# Patient Record
Sex: Female | Born: 1980 | Race: White | Hispanic: No | Smoking: Former smoker
Health system: Southern US, Community
[De-identification: ages and names within clinical notes are randomized; demographics above are authoritative.]

## PROBLEM LIST (undated history)

## (undated) DIAGNOSIS — K7689 Other specified diseases of liver: Secondary | ICD-10-CM

## (undated) DIAGNOSIS — K219 Gastro-esophageal reflux disease without esophagitis: Secondary | ICD-10-CM

## (undated) DIAGNOSIS — K5790 Diverticulosis of intestine, part unspecified, without perforation or abscess without bleeding: Secondary | ICD-10-CM

## (undated) DIAGNOSIS — F419 Anxiety disorder, unspecified: Secondary | ICD-10-CM

## (undated) DIAGNOSIS — U071 COVID-19: Secondary | ICD-10-CM

## (undated) DIAGNOSIS — F329 Major depressive disorder, single episode, unspecified: Secondary | ICD-10-CM

## (undated) DIAGNOSIS — K5792 Diverticulitis of intestine, part unspecified, without perforation or abscess without bleeding: Secondary | ICD-10-CM

## (undated) DIAGNOSIS — F32A Depression, unspecified: Secondary | ICD-10-CM

## (undated) HISTORY — DX: Anxiety disorder, unspecified: F41.9

## (undated) HISTORY — DX: Major depressive disorder, single episode, unspecified: F32.9

## (undated) HISTORY — DX: Depression, unspecified: F32.A

## (undated) HISTORY — DX: Gastro-esophageal reflux disease without esophagitis: K21.9

## (undated) HISTORY — PX: CARDIAC SURGERY: SHX584

## (undated) HISTORY — DX: Other specified diseases of liver: K76.89

## (undated) HISTORY — PX: VALVE REPLACEMENT: SUR13

---
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
## 2011-10-29 ENCOUNTER — Ambulatory Visit (INDEPENDENT_AMBULATORY_CARE_PROVIDER_SITE_OTHER): Payer: BC Managed Care – PPO | Admitting: Family Medicine

## 2011-10-29 VITALS — BP 109/72 | HR 66 | Temp 97.9°F | Resp 16 | Ht 63.0 in | Wt 112.6 lb

## 2011-10-29 DIAGNOSIS — M542 Cervicalgia: Secondary | ICD-10-CM

## 2011-10-29 DIAGNOSIS — M549 Dorsalgia, unspecified: Secondary | ICD-10-CM

## 2011-10-29 MED ORDER — METHOCARBAMOL 750 MG PO TABS: 750.0000 mg | ORAL_TABLET | Freq: Four times a day (QID) | ORAL | Status: AC

## 2011-10-29 MED ORDER — OXAPROZIN 600 MG PO TABS: 1200.0000 mg | ORAL_TABLET | Freq: Every day | ORAL | Status: AC

## 2011-10-29 MED ORDER — HYDROCODONE-ACETAMINOPHEN 5-500 MG PO TABS: 1.0000 | ORAL_TABLET | ORAL | Status: AC | PRN

## 2011-10-29 NOTE — Progress Notes (Signed)
Subjective: 31 year old female who was at a bachelorette party this weekend. She does not recall doing anything out of the ordinary that might have caused neck or back pains. Saturday was a fairly benign day. She slept Saturday night. Sunday morning she awakened with pain in her neck. Next having gotten worse to where it is intolerable. It radiates from the occiput down to the low back. His discomfort into the upper parts of her arms. Although she has had neck pain in the past, nothing like this.  Objective: Alert oriented young lady in moderate distress at this time. Any movement of her head and neck causes severe pain. She has decreased rotation, flexion, extension, and tilt of her head. Adequate range of motion of her upper arms. Fair flexion of her back. Exquisitely tender in the neck, trapezius, paraspinous muscles down her back.  Assessment: Cervical pain and spasm, with low back involvement.  Plan: Muscle relaxants and pain medication.

## 2011-10-29 NOTE — Patient Instructions (Signed)

## 2011-10-31 ENCOUNTER — Ambulatory Visit (INDEPENDENT_AMBULATORY_CARE_PROVIDER_SITE_OTHER): Payer: BC Managed Care – PPO | Admitting: Family Medicine

## 2011-10-31 ENCOUNTER — Ambulatory Visit: Payer: BC Managed Care – PPO

## 2011-10-31 VITALS — BP 108/67 | HR 51 | Temp 98.2°F | Resp 18 | Ht 64.0 in | Wt 114.0 lb

## 2011-10-31 DIAGNOSIS — M5380 Other specified dorsopathies, site unspecified: Secondary | ICD-10-CM

## 2011-10-31 DIAGNOSIS — M542 Cervicalgia: Secondary | ICD-10-CM

## 2011-10-31 DIAGNOSIS — M6282 Rhabdomyolysis: Secondary | ICD-10-CM

## 2011-10-31 DIAGNOSIS — M62838 Other muscle spasm: Secondary | ICD-10-CM

## 2011-10-31 DIAGNOSIS — M62830 Muscle spasm of back: Secondary | ICD-10-CM

## 2011-10-31 MED ORDER — KETOROLAC TROMETHAMINE 30 MG/ML IJ SOLN: 30.0000 mg | Freq: Once | INTRAMUSCULAR | Status: DC

## 2011-10-31 MED ORDER — DIAZEPAM 5 MG PO TABS: 5.0000 mg | ORAL_TABLET | Freq: Three times a day (TID) | ORAL | Status: AC | PRN

## 2011-10-31 MED ORDER — KETOROLAC TROMETHAMINE 30 MG/ML IJ SOLN
30.0000 mg | Freq: Once | INTRAMUSCULAR | Status: AC
  Administered 2011-10-31: 30 mg via INTRAMUSCULAR

## 2011-10-31 MED ORDER — KETOROLAC TROMETHAMINE 30 MG/ML IM SOLN: 30.0000 mg | Freq: Once | INTRAMUSCULAR | Status: DC

## 2011-10-31 NOTE — Patient Instructions (Addendum)
Pain medication and muscle relaxant.  Increase Celexa to 2 tablets at bedtime.  Diazepam  5 mg every 8 hours for relaxation.  Referral is being made to physical therapy. If you do not hear from the office by tomorrow morning with regard to clean it is set up for, please call.

## 2011-10-31 NOTE — Progress Notes (Signed)
Subjective: Patient was here 2 days ago. Over the weekend she developed severe muscle spasming. She of any injury. We treated her with anti-inflammatory meds, pain meds, and opioid medication. She has not gotten any relief. In fact has gotten worse. She is very distressed and painful after waiting here so long today. Hurts her neck although again her back. Again the right arm.   objective: White female in significant discomfort distress. Her neck is fair range is extremely tender down both paraspinous muscle areas although again to the low back. Tender spasms bilateral trapezius.  Assessment:  Cervical and back spasm severe pain  Plan: C-spine [facility] reading (PRIMARY) by  Dr. Alwyn Ren cervical x-rays show straightening but otherwise normal.  And Valium for additional muscle relaxation. Gave with 30 mg of Toradol injection here. Make referral to physical therapy. Need evaluation and treatment.

## 2015-08-29 DIAGNOSIS — E785 Hyperlipidemia, unspecified: Secondary | ICD-10-CM | POA: Insufficient documentation

## 2015-08-29 DIAGNOSIS — J309 Allergic rhinitis, unspecified: Secondary | ICD-10-CM | POA: Insufficient documentation

## 2015-08-29 DIAGNOSIS — G8929 Other chronic pain: Secondary | ICD-10-CM | POA: Insufficient documentation

## 2015-08-29 DIAGNOSIS — M542 Cervicalgia: Secondary | ICD-10-CM

## 2015-08-29 DIAGNOSIS — M5020 Other cervical disc displacement, unspecified cervical region: Secondary | ICD-10-CM | POA: Insufficient documentation

## 2018-07-08 ENCOUNTER — Emergency Department (INDEPENDENT_AMBULATORY_CARE_PROVIDER_SITE_OTHER): Payer: BLUE CROSS/BLUE SHIELD

## 2018-07-08 ENCOUNTER — Emergency Department (INDEPENDENT_AMBULATORY_CARE_PROVIDER_SITE_OTHER)
Admission: EM | Admit: 2018-07-08 | Discharge: 2018-07-08 | Disposition: A | Discharge status: Home / Self Care | Payer: BLUE CROSS/BLUE SHIELD | Source: Home / Self Care | Attending: Family Medicine | Admitting: Family Medicine

## 2018-07-08 ENCOUNTER — Emergency Department
Admission: EM | Admit: 2018-07-08 | Discharge: 2018-07-08 | Discharge status: Absent without leave | Payer: Self-pay | Source: Home / Self Care

## 2018-07-08 ENCOUNTER — Encounter: Payer: Self-pay | Admitting: *Deleted

## 2018-07-08 DIAGNOSIS — R0789 Other chest pain: Secondary | ICD-10-CM

## 2018-07-08 DIAGNOSIS — K219 Gastro-esophageal reflux disease without esophagitis: Secondary | ICD-10-CM | POA: Diagnosis not present

## 2018-07-08 DIAGNOSIS — S39011A Strain of muscle, fascia and tendon of abdomen, initial encounter: Secondary | ICD-10-CM

## 2018-07-08 DIAGNOSIS — R109 Unspecified abdominal pain: Secondary | ICD-10-CM

## 2018-07-08 DIAGNOSIS — R1012 Left upper quadrant pain: Secondary | ICD-10-CM

## 2018-07-08 DIAGNOSIS — R1013 Epigastric pain: Secondary | ICD-10-CM | POA: Diagnosis not present

## 2018-07-08 HISTORY — DX: Diverticulosis of intestine, part unspecified, without perforation or abscess without bleeding: K57.90

## 2018-07-08 HISTORY — DX: Diverticulitis of intestine, part unspecified, without perforation or abscess without bleeding: K57.92

## 2018-07-08 LAB — POCT CBC W AUTO DIFF (K'VILLE URGENT CARE)

## 2018-07-08 LAB — POCT URINALYSIS DIP (MANUAL ENTRY)
BILIRUBIN UA: NEGATIVE
Blood, UA: NEGATIVE
Glucose, UA: NEGATIVE mg/dL
Leukocytes, UA: NEGATIVE
NITRITE UA: NEGATIVE
PH UA: 5.5 (ref 5.0–8.0)
Protein Ur, POC: NEGATIVE mg/dL
Spec Grav, UA: 1.02 (ref 1.010–1.025)
Urobilinogen, UA: 0.2 E.U./dL

## 2018-07-08 MED ORDER — ESOMEPRAZOLE MAGNESIUM 40 MG PO CPDR: DELAYED_RELEASE_CAPSULE | ORAL | 1 refills | Status: DC

## 2018-07-08 MED ORDER — IOPAMIDOL (ISOVUE-300) INJECTION 61%
100.0000 mL | Freq: Once | INTRAVENOUS | Status: AC | PRN
  Administered 2018-07-08: 100 mL via INTRAVENOUS

## 2018-07-08 NOTE — ED Triage Notes (Signed)
Recent intense workout sessions and during a session a week ago pt had sudden onset stabbing type LUQ/epigastric abd pain which has persisted. Onset of N/V/D within 2-3 days of pain onset which has also persisted. Pt has refrained from workouts for the last 2-3 days but pain continues. Past hx of diverticulitis last August which she relates as similar pain. Also, intermittent right upper chest pain which she also describes as stabbing and is associated with dyspnea.

## 2018-07-08 NOTE — ED Provider Notes (Signed)
[facility]    CSN: [id number] Arrival date & time: 07/08/18  1319     History   Chief Complaint Chief Complaint  Patient presents with  . Abdominal Pain  . Nausea  . Emesis  . Chest Pain  . Diarrhea    HPI Gwendolyn Lee is a 37 y.o. female.   Patient participates in intense physical exercise six days per week.  About one week ago after a workout she developed pleuritic pain in her left inferior/lateral chest that has persisted.  She reports that she has a chronic smoker's cough, always worse upon awakening, and her left chest pain is exacerbated by cough, but she denies dyspnea.  Patient is s/p cardiac valve repair 1988, with median sternotomy sutures in place. About 2 to 3 days after developing left chest pain, she developed nausea/vomiting and diarrhea with low grade fever and left lower abdominal pain.  She states that her bowel movements are normally always somewhat loose.  Her GI symptoms have improved but she still has left abdominal pain.  She had diverticulitis in August 2018 (verified by CT abdomen), and her present pain feels similar.  During the past week patient has also experienced substernal discomfort when swallowing, improved somewhat with Tums. Patient's last menstrual period was 06/16/2018.  She notes that she had spotting yesterday, but denies vaginal discharge or pelvic pain.  The history is provided by the patient.    Past Medical History:  Diagnosis Date  . Diverticulitis   . Diverticulosis     Active problems:  none   Past Surgical History:  Procedure Laterality Date  . CARDIAC SURGERY             Home Medications    Prior to Admission medications   Not on File Ibuprofen, Tums    Family History Family History  Problem Relation Age of Onset  . Healthy Mother   . Healthy Father     Social History Social History   Tobacco Use  . Smoking status: Current Every Day Smoker    Packs/day: 0.30  . Smokeless tobacco: Never  Used  Substance Use Topics  . Alcohol use: Yes  . Drug use: Not on file     Allergies   Ceclor [cefaclor]   Review of Systems Review of Systems  Constitutional: Positive for activity change, appetite change, diaphoresis and fatigue. Negative for chills, fever and unexpected weight change.  HENT: Negative.   Eyes: Negative.   Respiratory: Positive for cough and chest tightness. Negative for choking, shortness of breath and wheezing.   Cardiovascular: Negative.   Gastrointestinal: Positive for abdominal pain, diarrhea, nausea and vomiting. Negative for blood in stool.  Genitourinary: Negative.   Musculoskeletal: Negative.   Skin: Negative.   Neurological: Negative for headaches.     Physical Exam Triage Vital Signs ED Triage Vitals  Enc Vitals Group     BP 07/08/18 1348 125/82     Pulse Rate 07/08/18 1348 82     Resp 07/08/18 1348 16     Temp 07/08/18 1348 98.2 F (36.8 C)     Temp Source 07/08/18 1348 Oral     SpO2 07/08/18 1348 99 %     Weight 07/08/18 1349 120 lb (54.4 kg)     Height 07/08/18 1349 5\' 5"  (1.651 m)     Head Circumference --      Peak Flow --      Pain Score 07/08/18 1348 5     Pain Loc --  Pain Edu? --      Excl. in [place]? --    No data found.  Updated Vital Signs BP 125/82 (BP Location: Right Arm)   Pulse 82   Temp 98.2 F (36.8 C) (Oral)   Resp 16   Ht 5\' 5"  (1.651 m)   Wt 54.4 kg   LMP 06/16/2018   SpO2 99%   BMI 19.97 kg/m   Visual Acuity Right Eye Distance:   Left Eye Distance:   Bilateral Distance:    Right Eye Near:   Left Eye Near:    Bilateral Near:     Physical Exam Vitals signs and nursing note reviewed.  Constitutional:      General: She is in acute distress.     Appearance: She is well-developed. She is not ill-appearing or diaphoretic.  HENT:     Head: Normocephalic.     Right Ear: Tympanic membrane, ear canal and external ear normal.     Left Ear: Tympanic membrane, ear canal and external ear normal.      Nose: Nose normal.     Mouth/Throat:     Mouth: Mucous membranes are moist.     Pharynx: Oropharynx is clear.  Eyes:     Conjunctiva/sclera: Conjunctivae normal.     Pupils: Pupils are equal, round, and reactive to light.  Neck:     Musculoskeletal: Neck supple.  Cardiovascular:     Rate and Rhythm: Normal rate.     Heart sounds: Normal heart sounds.  Pulmonary:     Effort: Pulmonary effort is normal.     Breath sounds: Normal breath sounds. No wheezing or rales.    Chest:     Chest wall: Tenderness present.    Abdominal:     General: Abdomen is flat. There is no distension.     Palpations: Abdomen is soft. There is no mass.     Tenderness: There is abdominal tenderness. There is guarding. There is no right CVA tenderness, left CVA tenderness or rebound.     Hernia: No hernia is present.    Musculoskeletal:        General: No swelling or tenderness.     Right lower leg: No edema.     Left lower leg: No edema.  Lymphadenopathy:     Cervical: No cervical adenopathy.  Skin:    General: Skin is warm and dry.  Neurological:     General: No focal deficit present.     Mental Status: She is alert.      UC Treatments / Results  Labs (all labs ordered are listed, but only abnormal results are displayed) Labs Reviewed  POCT URINALYSIS DIP (MANUAL ENTRY) - Abnormal; Notable for the following components:      Result Value   Ketones, POC UA large (80) (*)    All other components within normal limits  COMPLETE METABOLIC PANEL WITH GFR  AMYLASE  LIPASE  POCT CBC W AUTO DIFF ([facility]):  WBC 6.6; LY 35.0; MO 19.2; GR 45.8; Hgb 13.6; Platelets 122     EKG None  Radiology Dg Ribs Unilateral W/chest Left  Result Date: 07/08/2018 CLINICAL DATA:  Sudden onset of stabbing left upper quadrant epigastric pain while working out last week. EXAM: LEFT RIBS AND CHEST - 3+ VIEW COMPARISON:  None. FINDINGS: No fracture or other bone lesions are seen involving the ribs. The  patient is status post median sternotomy. The most inferior sternotomy wire is broken. There is no evidence of pneumothorax or  pleural effusion. Both lungs are clear. Heart size and mediastinal contours are within normal limits. IMPRESSION: Negative for acute cardiopulmonary nor osseous abnormality. Electronically Signed   By: Ashley Royalty M.D.   On: 07/08/2018 14:50   Ct Abdomen Pelvis W Contrast  Result Date: 07/08/2018 CLINICAL DATA:  37 y/o  F; left-sided abdominal pain for 1 week. EXAM: CT ABDOMEN AND PELVIS WITH CONTRAST TECHNIQUE: Multidetector CT imaging of the abdomen and pelvis was performed using the standard protocol following bolus administration of intravenous contrast. CONTRAST:  124mL ISOVUE-300 IOPAMIDOL (ISOVUE-300) INJECTION 61% COMPARISON:  03/04/2017 CT abdomen and pelvis FINDINGS: Lower chest: No acute abnormality. Hepatobiliary: Multiple liver hypodensities are stable measuring up to 16 mm in the right lobe of liver (series 2, image 26) compatible with cysts. No additional focal liver abnormality is seen. No gallstones, gallbladder wall thickening, or biliary dilatation. Pancreas: Unremarkable. No pancreatic ductal dilatation or surrounding inflammatory changes. Spleen: Normal in size without focal abnormality. Adrenals/Urinary Tract: Adrenal glands are unremarkable. Kidneys are normal, without renal calculi, focal lesion, or hydronephrosis. Bladder is unremarkable. Stomach/Bowel: Stomach is within normal limits. Appendix appears normal. No evidence of bowel wall thickening, distention, or inflammatory changes. Few sigmoid diverticula, no findings of acute diverticulitis at this time identified. Vascular/Lymphatic: No significant vascular findings are present. No enlarged abdominal or pelvic lymph nodes. Reproductive: Uterus and bilateral adnexa are unremarkable. Other: No abdominal wall hernia or abnormality. Trace fluid in the cul-de-sac, likely physiologic. Musculoskeletal: No fracture  is seen. IMPRESSION: No acute process identified.  Stable chronic findings as above. Electronically Signed   By: Kristine Garbe M.D.   On: 07/08/2018 18:28    Procedures Procedures (including critical care time)  Medications Ordered in UC Medications - No data to display  Initial Impression / Assessment and Plan / UC Course  I have reviewed the triage vital signs and the nursing notes.  Pertinent labs & imaging results that were available during my care of the patient were reviewed by me and considered in my medical decision making (see chart for details).    Note broken inferior sternotomy wire suture, otherwise negative chest x-ray.  Suspect sternum strain and/or left intercostal muscle strain vs ?costochondritis. Suspect GERD; begin trial of omeprazole 40mg  daily. Negative CT abdomen/pelvis, normal WBC and urinalysis reassuring.  CMP, amylase, lipase pending. Begin Nexium.  Recommend follow-up with GI in about 3 weeks. Recommend further evaluation by Sports Medicine.   Final Clinical Impressions(s) / UC Diagnoses   Final diagnoses:  Gastroesophageal reflux disease, esophagitis presence not specified  Musculoskeletal chest pain  Strain of rectus abdominis muscle, initial encounter     Discharge Instructions     Apply ice pack to lower sternum and left chest for 20 to 30 minutes, 3 to 4 times daily  Continue until pain and swelling decrease.     ED Prescriptions    Medication Sig Dispense Auth. Provider   esomeprazole (NEXIUM) 40 MG capsule Take one cap by mouth once daily, 20 minutes before a meal 30 capsule Kandra Nicolas, MD         Kandra Nicolas, MD 07/08/18 2123

## 2018-07-08 NOTE — Discharge Instructions (Signed)
Apply ice pack to lower sternum and left chest for 20 to 30 minutes, 3 to 4 times daily  Continue until pain and swelling decrease.

## 2018-07-10 ENCOUNTER — Telehealth: Payer: Self-pay

## 2018-07-10 LAB — COMPLETE METABOLIC PANEL WITH GFR
AG RATIO: 2.1 (calc) (ref 1.0–2.5)
ALT: 38 U/L — ABNORMAL HIGH (ref 6–29)
AST: 57 U/L — ABNORMAL HIGH (ref 10–30)
Albumin: 5 g/dL (ref 3.6–5.1)
Alkaline phosphatase (APISO): 45 U/L (ref 33–115)
BUN: 17 mg/dL (ref 7–25)
CALCIUM: 9.8 mg/dL (ref 8.6–10.2)
CO2: 23 mmol/L (ref 20–32)
Chloride: 104 mmol/L (ref 98–110)
Creat: 0.75 mg/dL (ref 0.50–1.10)
GFR, Est African American: 120 mL/min/{1.73_m2} (ref >=60)
GFR, Est Non African American: 103 mL/min/{1.73_m2} (ref >=60)
Globulin: 2.4 g/dL (calc) (ref 1.9–3.7)
Glucose, Bld: 69 mg/dL (ref 65–99)
Potassium: 3.6 mmol/L (ref 3.5–5.3)
Sodium: 138 mmol/L (ref 135–146)
Total Bilirubin: 0.6 mg/dL (ref 0.2–1.2)
Total Protein: 7.4 g/dL (ref 6.1–8.1)

## 2018-07-10 LAB — AMYLASE: Amylase: 60 U/L (ref 21–101)

## 2018-07-10 LAB — LIPASE: LIPASE: 42 U/L (ref 7–60)

## 2018-07-10 NOTE — Telephone Encounter (Signed)
Called patient left results of labs, and told her to follow up with PCP for recheck of liver function.

## 2018-07-21 ENCOUNTER — Ambulatory Visit: Payer: Self-pay | Admitting: Physician Assistant

## 2018-07-21 ENCOUNTER — Telehealth: Payer: Self-pay

## 2018-07-21 NOTE — Telephone Encounter (Signed)
Noted  

## 2018-07-21 NOTE — Telephone Encounter (Signed)
Pt called at 8:02 to cancel her new patient appt scheduled for 10:50.  FYI to provider

## 2018-08-01 ENCOUNTER — Ambulatory Visit: Payer: BLUE CROSS/BLUE SHIELD | Admitting: Physician Assistant

## 2018-08-11 ENCOUNTER — Ambulatory Visit (INDEPENDENT_AMBULATORY_CARE_PROVIDER_SITE_OTHER): Payer: BLUE CROSS/BLUE SHIELD | Admitting: Physician Assistant

## 2018-08-11 ENCOUNTER — Ambulatory Visit: Payer: BLUE CROSS/BLUE SHIELD | Admitting: Physician Assistant

## 2018-08-11 ENCOUNTER — Encounter: Payer: Self-pay | Admitting: Physician Assistant

## 2018-08-11 VITALS — BP 131/86 | HR 60 | Temp 98.7°F | Ht 65.0 in | Wt 124.0 lb

## 2018-08-11 DIAGNOSIS — Z7689 Persons encountering health services in other specified circumstances: Secondary | ICD-10-CM

## 2018-08-11 DIAGNOSIS — F331 Major depressive disorder, recurrent, moderate: Secondary | ICD-10-CM | POA: Diagnosis not present

## 2018-08-11 DIAGNOSIS — F1290 Cannabis use, unspecified, uncomplicated: Secondary | ICD-10-CM

## 2018-08-11 DIAGNOSIS — J0100 Acute maxillary sinusitis, unspecified: Secondary | ICD-10-CM

## 2018-08-11 DIAGNOSIS — F410 Panic disorder [episodic paroxysmal anxiety] without agoraphobia: Secondary | ICD-10-CM | POA: Insufficient documentation

## 2018-08-11 DIAGNOSIS — F5104 Psychophysiologic insomnia: Secondary | ICD-10-CM

## 2018-08-11 DIAGNOSIS — F419 Anxiety disorder, unspecified: Secondary | ICD-10-CM | POA: Diagnosis not present

## 2018-08-11 MED ORDER — SERTRALINE HCL 25 MG PO TABS: ORAL_TABLET | ORAL | 0 refills | Status: DC

## 2018-08-11 MED ORDER — BUSPIRONE HCL 7.5 MG PO TABS: ORAL_TABLET | ORAL | 1 refills | Status: DC

## 2018-08-11 MED ORDER — DOXYCYCLINE HYCLATE 100 MG PO TABS: 100.0000 mg | ORAL_TABLET | Freq: Two times a day (BID) | ORAL | 0 refills | Status: AC

## 2018-08-11 MED ORDER — TRAZODONE HCL 50 MG PO TABS: 25.0000 mg | ORAL_TABLET | Freq: Every evening | ORAL | 2 refills | Status: DC | PRN

## 2018-08-11 NOTE — Patient Instructions (Addendum)
Counseling: - psychologytoday.com: search engine to locate local counselors - Family Services in your county offer counseling on a sliding scale (pay what you can afford) - [facility]: we can place a referral for you to see one of licensed counselors in [place], [place], or [place] - online counseling: Chartered loss adjuster (not covered by insurance, but affordable self-pay rates)  Other resources: Best Overall for Online Counseling: Talkspace "Talkspace's unlimited messaging and video conferencing makes it a convenient app for addressing a variety of needs."   Best Live Chat Sessions: Betterhelp "Betterhelp allows a variety of ways to contact your therapist, including live chat sessions."  Best for Couples: [facility] "[facility] specializes in couples therapy and they offer a variety of services to help address relationship issues."  Best for a Quick Consultation: [facility]well "[facility]well offers access to a variety of mental health professionals any time of day or night."  Best for Peer Support: 7 Cups of Tea "You can access free support from peers on 7 Cups of Tea or you can choose to pay to speak to a professional."  Best for Teens: Teen Counsel[facility] "Teens can chat, message, speak over the phone, or video conference with a therapist who has experience treating their age group."  Best for LGBTQ: [facility] "[facility] offers online therapy to individuals in the [place] community, and their goal is to offer discreet, affordable, and accessible treatment."  Best Free Assessment: Doctor on Demand "Doctor on Demand provides access to a free assessment that can help you determine if you should talk to someone about anxiety or depression."  Best for Access to a Psychiatrist: [facility]DL[facility] "[facility]DL[facility] provides access to psychiatrists who can prescribe and manage psychiatric medications."  Safety Plan: if having self-harm or suicidal thoughts Our Office  [place] 1-800-SUICIDE If in immediate danger of harming yourself, go to the nearest emergency room or call 911

## 2018-08-11 NOTE — Progress Notes (Signed)
HPI:                                                                Gwendolyn Lee is a 38 y.o. female who presents to [place]: [facility] today to establish care  Current concerns:  Depression/Anxiety: has struggled with depression for 20+ years. Has been on and off medication throughout her life. Has not taken anything in over 10 years. Prior meds include Zoloft.  Has been separated from husband for 1 year and is struggling with this. States depression and anxiety symptoms have become debilitating. She is struggling to get out of bed in the morning. She is self-employed and has a big business trip coming up to [facility] that she needs to be ready for.  Denies symptoms of mania/hypomania. Denies suicidal thinking. Denies auditory/visual hallucinations.   Insomnia - this is a chronic problem, worsening recently. Using unknown OTC sleep aid and alternating with Tylenol PM as needed. She smokes mariuana nightly to help with sleep.  She is trying to eat healthier and is exercising 5-6 days per week, but this does not help with sleep or anxiety.  She also has had cold symptoms for approx 1 week. Complains of nasal congestion and sinus pressure with dry cough today. Denies fever, chest pain, sputum production or dyspnea.    Depression screen PHQ 2/9 08/11/2018  Decreased Interest 2  Down, Depressed, Hopeless 2  PHQ - 2 Score 4  Altered sleeping 3  Tired, decreased energy 3  Change in appetite 1  Feeling bad or failure about yourself  2  Trouble concentrating 0  Moving slowly or fidgety/restless 0  Suicidal thoughts 0  PHQ-9 Score 13    GAD 7 : Generalized Anxiety Score 08/11/2018  Nervous, Anxious, on Edge 3  Control/stop worrying 2  Worry too much - different things 2  Trouble relaxing 2  Restless 0  Easily annoyed or irritable 3  Afraid - awful might happen 0  Total GAD 7 Score 12  Anxiety Difficulty Very difficult      Past  Medical History:  Diagnosis Date  . Anxiety   . Depression   . Diverticulitis   . Diverticulitis   . Diverticulosis    Past Surgical History:  Procedure Laterality Date  . CARDIAC SURGERY     Social History   Tobacco Use  . Smoking status: Current Every Day Smoker    Packs/day: 0.30  . Smokeless tobacco: Never Used  Substance Use Topics  . Alcohol use: Yes    Alcohol/week: 20.0 standard drinks    Types: 20 Standard drinks or equivalent per week   family history includes Healthy in her father and mother.    ROS: Review of Systems  Constitutional: Positive for malaise/fatigue.  HENT: Positive for sinus pain and sore throat.   Respiratory: Positive for cough.   Neurological: Positive for headaches.  Psychiatric/Behavioral: Positive for depression. The patient is nervous/anxious and has insomnia.   All other systems reviewed and are negative.    Medications: No current outpatient medications on file.   No current facility-administered medications for this visit.    Allergies  Allergen Reactions  . Ceclor [Cefaclor] Rash       Objective:  BP 131/86  Pulse 60   Temp 98.7 F (37.1 C) (Oral)   Ht 5\' 5"  (1.651 m)   Wt 124 lb (56.2 kg)   LMP 07/14/2018 (Exact Date)   BMI 20.63 kg/m  Gen:  alert, not ill-appearing, no distress, appropriate for age 44: head normocephalic without obvious abnormality, conjunctiva and cornea clear, tympanic membranes pearly gray and semitransparent bilaterally, external ear canals normal bilaterally, no mastoid tenderness, nasal mucosa edematous, there is bilateral maxillary sinus tenderness, oropharynx clear, uvula midline, neck supple, no cervical adenopathy trachea midline Pulm: Normal work of breathing, normal phonation, clear to auscultation bilaterally, no wheezes, rales or rhonchi CV: Normal rate, regular rhythm, s1 and s2 distinct, no murmurs, clicks or rubs  Neuro: alert and oriented x 3, no tremor MSK: extremities  atraumatic, normal gait and station Skin: intact, no rashes on exposed skin, no jaundice, no cyanosis Psych: well-groomed, cooperative, good eye contact, depressed mood, affect mood-congruent, she is tearful, speech is articulate, and thought processes clear and goal-directed, normal judgment, good insight    No results found for this or any previous visit (from the past 72 hour(s)). No results found.    Assessment and Plan: 38 y.o. female with   .Gwendolyn Lee was seen today for establish care.  Diagnoses and all orders for this visit:  Encounter to establish care  Chronic insomnia -     traZODone (DESYREL) 50 MG tablet; Take 0.5-1 tablets (25-50 mg total) by mouth at bedtime as needed for sleep.  Adjustment disorder with mixed anxiety and depressed mood -     traZODone (DESYREL) 50 MG tablet; Take 0.5-1 tablets (25-50 mg total) by mouth at bedtime as needed for sleep. -     busPIRone (BUSPAR) 7.5 MG tablet; Take 1 tablet (7.5 mg total) by mouth 2 (two) times daily for 5 days, THEN 1 tablet (7.5 mg total) 3 (three) times daily for 25 days. -     sertraline (ZOLOFT) 25 MG tablet; Take 1 tablet (25 mg total) by mouth at bedtime for 3 days, THEN 2 tablets (50 mg total) at bedtime for 7 days, THEN 3 tablets (75 mg total) at bedtime for 14 days.  Acute non-recurrent maxillary sinusitis -     doxycycline (VIBRA-TABS) 100 MG tablet; Take 1 tablet (100 mg total) by mouth 2 (two) times daily for 7 days.   Depression/anxiety/chronic insomnia PHQ 9 equals 13, no acute safety issues Gad 7 equals 12 She has done well on sertraline in the past so we will restart this.  Will take a number of weeks to have any therapeutic effect and she has an upcoming trip planned.  Starting BuSpar 7 and half milligrams twice daily, titrate to 7-1/2 mg 3 times daily after 5 days Also adding trazodone 25 to 50 mg at bedtime as needed for sleep Encouraged to decrease her use of marijuana as this may interact with her  medications She was provided with information on local counselors and will contact me if she needs a referral Follow-up in 1 month  Acute maxillary sinusitis Persistent symptoms for over 1 week.  Treating empirically for bacterial sinusitis with doxycycline.  Patient education and anticipatory guidance given Patient agrees with treatment plan Follow-up in 1 month or sooner as needed if symptoms worsen or fail to improve  Darlyne Russian PA-C

## 2018-09-06 ENCOUNTER — Other Ambulatory Visit: Payer: Self-pay | Admitting: Physician Assistant

## 2018-09-06 DIAGNOSIS — F5104 Psychophysiologic insomnia: Secondary | ICD-10-CM

## 2018-09-06 DIAGNOSIS — F331 Major depressive disorder, recurrent, moderate: Secondary | ICD-10-CM

## 2018-09-08 ENCOUNTER — Encounter: Payer: Self-pay | Admitting: Physician Assistant

## 2018-09-08 ENCOUNTER — Ambulatory Visit (INDEPENDENT_AMBULATORY_CARE_PROVIDER_SITE_OTHER): Payer: BLUE CROSS/BLUE SHIELD | Admitting: Physician Assistant

## 2018-09-08 VITALS — BP 122/80 | HR 65 | Wt 123.0 lb

## 2018-09-08 DIAGNOSIS — F419 Anxiety disorder, unspecified: Secondary | ICD-10-CM | POA: Diagnosis not present

## 2018-09-08 DIAGNOSIS — R110 Nausea: Secondary | ICD-10-CM | POA: Insufficient documentation

## 2018-09-08 DIAGNOSIS — F5104 Psychophysiologic insomnia: Secondary | ICD-10-CM | POA: Diagnosis not present

## 2018-09-08 DIAGNOSIS — R197 Diarrhea, unspecified: Secondary | ICD-10-CM | POA: Insufficient documentation

## 2018-09-08 DIAGNOSIS — F331 Major depressive disorder, recurrent, moderate: Secondary | ICD-10-CM | POA: Diagnosis not present

## 2018-09-08 DIAGNOSIS — R7401 Elevation of levels of liver transaminase levels: Secondary | ICD-10-CM | POA: Insufficient documentation

## 2018-09-08 DIAGNOSIS — R740 Nonspecific elevation of levels of transaminase and lactic acid dehydrogenase [LDH]: Secondary | ICD-10-CM

## 2018-09-08 DIAGNOSIS — K5790 Diverticulosis of intestine, part unspecified, without perforation or abscess without bleeding: Secondary | ICD-10-CM

## 2018-09-08 DIAGNOSIS — N760 Acute vaginitis: Secondary | ICD-10-CM

## 2018-09-08 DIAGNOSIS — D696 Thrombocytopenia, unspecified: Secondary | ICD-10-CM | POA: Insufficient documentation

## 2018-09-08 MED ORDER — ESOMEPRAZOLE MAGNESIUM 40 MG PO CPDR: 40.0000 mg | DELAYED_RELEASE_CAPSULE | Freq: Every day | ORAL | 0 refills | Status: DC

## 2018-09-08 MED ORDER — VORTIOXETINE HBR 5 MG PO TABS: 5.0000 mg | ORAL_TABLET | Freq: Every day | ORAL | 1 refills | Status: DC

## 2018-09-08 MED ORDER — FLUCONAZOLE 150 MG PO TABS: 150.0000 mg | ORAL_TABLET | Freq: Once | ORAL | 0 refills | Status: AC

## 2018-09-08 MED ORDER — ONDANSETRON 4 MG PO TBDP: 4.0000 mg | ORAL_TABLET | Freq: Three times a day (TID) | ORAL | 0 refills | Status: DC | PRN

## 2018-09-08 MED ORDER — BUSPIRONE HCL 10 MG PO TABS: 10.0000 mg | ORAL_TABLET | Freq: Three times a day (TID) | ORAL | 2 refills | Status: DC

## 2018-09-08 NOTE — Progress Notes (Signed)
HPI:                                                                Gwendolyn Lee is a 38 y.o. female who presents to [facility] today to establish care   Depression/Anxiety: has struggled with depression for 20+ years. Has been on and off medication throughout her life. Has not taken anything in over 10 years. Prior meds include Zoloft.  Has been separated from husband for 1 year and is struggling with this. States depression and anxiety symptoms have become debilitating. She is struggling to get out of bed in the morning. She is self-employed and has a big business trip coming up to [facility] that she needs to be ready for.  Denies symptoms of mania/hypomania. Denies suicidal thinking. Denies auditory/visual hallucinations.   Insomnia - this is a chronic problem, worsening recently. Using unknown OTC sleep aid and alternating with Tylenol PM as needed. She smokes mariuana nightly to help with sleep.  She is trying to eat healthier and is exercising 5-6 days per week, but this does not help with sleep or anxiety. Drinks approx 20 drinks/week Occasionally smokes marijuana  Interval Hx 09/08/18 Reports only sleeping 4 hours per night with Trazodone 50 mg. No improvement in mood or depressive symptoms. Feels like Buspar is helping with anxiety. She is now seeing a Social worker at [facility]) weekly for an eating disorder Reports she is nauseous all the time and feels it is worsening over the last month. She occasionally vomits immediately following a meal. She is having loose stools 5-7 times per day. Symptoms are causing her to avoid food. She is losing weight.   She also feels like she may have a yeast infection. Since completing antibiotic for sinusitis she has had some mild vaginal discomfort/itching. Sexually active with 1 female partner, uses condoms. Denies known exposure to STI.  Depression screen [facility] 2/9 09/08/2018 08/11/2018   Decreased Interest 2 2  Down, Depressed, Hopeless 3 2  PHQ - 2 Score 5 4  Altered sleeping 3 3  Tired, decreased energy 3 3  Change in appetite 3 1  Feeling bad or failure about yourself  1 2  Trouble concentrating 0 0  Moving slowly or fidgety/restless 1 0  Suicidal thoughts 0 0  PHQ-9 Score 16 13    GAD 7 : Generalized Anxiety Score 09/08/2018 08/11/2018  Nervous, Anxious, on Edge 3 3  Control/stop worrying 3 2  Worry too much - different things 3 2  Trouble relaxing 3 2  Restless 1 0  Easily annoyed or irritable 2 3  Afraid - awful might happen 1 0  Total GAD 7 Score 16 12  Anxiety Difficulty - Very difficult      Past Medical History:  Diagnosis Date  . Anxiety   . Depression   . Diverticulitis   . Diverticulitis   . Diverticulosis    Past Surgical History:  Procedure Laterality Date  . CARDIAC SURGERY    . VALVE REPLACEMENT     Social History   Tobacco Use  . Smoking status: Current Every Day Smoker    Packs/day: 0.30  . Smokeless tobacco: Never Used  Substance Use Topics  . Alcohol use:  Yes    Alcohol/week: 20.0 standard drinks    Types: 20 Standard drinks or equivalent per week   family history includes Healthy in her father and mother.    ROS: Review of Systems  Constitutional: Positive for malaise/fatigue and weight loss.  Gastrointestinal: Positive for diarrhea and nausea. Negative for blood in stool and melena.  Neurological: Positive for headaches.  Psychiatric/Behavioral: Positive for depression. The patient is nervous/anxious and has insomnia.   All other systems reviewed and are negative.    Medications: Current Outpatient Medications  Medication Sig Dispense Refill  . busPIRone (BUSPAR) 10 MG tablet Take 1 tablet (10 mg total) by mouth 3 (three) times daily. 90 tablet 2  . esomeprazole (NEXIUM) 40 MG capsule Take 1 capsule (40 mg total) by mouth daily before breakfast. 30 capsule 0  . fluconazole (DIFLUCAN) 150 MG tablet Take 1  tablet (150 mg total) by mouth once for 1 dose. May repeat 1 tablet PO in 72 hours if symptoms persist/recur 2 tablet 0  . ondansetron (ZOFRAN-ODT) 4 MG disintegrating tablet Take 1 tablet (4 mg total) by mouth every 8 (eight) hours as needed for nausea or vomiting. 10 tablet 0  . vortioxetine HBr (TRINTELLIX) 5 MG TABS tablet Take 1 tablet (5 mg total) by mouth daily. 30 tablet 1   No current facility-administered medications for this visit.    Allergies  Allergen Reactions  . Ceclor [Cefaclor] Rash       Objective:  BP 122/80   Pulse 65   Wt 123 lb (55.8 kg)   BMI 20.47 kg/m  Gen:  alert, not ill-appearing, no distress, appropriate for age 36: head normocephalic without obvious abnormality, conjunctiva and cornea clear, tympanic membranes pearly gray and semitransparent bilaterally, external ear canals normal bilaterally, no mastoid tenderness, nasal mucosa edematous, there is bilateral maxillary sinus tenderness, oropharynx clear, uvula midline, neck supple, no cervical adenopathy trachea midline Pulm: Normal work of breathing, normal phonation, clear to auscultation bilaterally, no wheezes, rales or rhonchi CV: Normal rate, regular rhythm, s1 and s2 distinct, no murmurs, clicks or rubs  Neuro: alert and oriented x 3, no tremor MSK: extremities atraumatic, normal gait and station Skin: intact, no rashes on exposed skin, no jaundice, no cyanosis Psych: well-groomed, cooperative, good eye contact, depressed mood, affect mood-congruent, she is tearful, speech is articulate, and thought processes clear and goal-directed, normal judgment, good insight  CT ABDOMEN PELVIS W CONTRAST (ROUTINE)03/04/2017 [facility] Other Result Information  This result has an attachment that is not available.  Result Narrative  CLINICAL DATA: Left lower quadrant and left flank pain.  EXAM: CT ABDOMEN AND PELVIS WITH CONTRAST  TECHNIQUE: Multidetector CT imaging of the  abdomen and pelvis was performed using the standard protocol following bolus administration of intravenous contrast.  CONTRAST: 100 cc Omnipaque 300 IV.  COMPARISON: None.  FINDINGS: Lower chest: No significant pulmonary nodules or acute consolidative airspace disease.  Hepatobiliary: Normal liver size. Simple 1.6 cm right liver lobe cysts. Two sub-5 mm hypodense right liver lobe lesions are too small to characterize and require no further follow up unless the patient has risk factors for liver malignancy. No additional liver lesions. Normal gallbladder with no radiopaque cholelithiasis. No biliary ductal dilatation.  Pancreas: Normal, with no mass or duct dilation.  Spleen: Normal size. No mass.  Adrenals/Urinary Tract: Normal adrenals. Normal size kidneys. Normal symmetric contrast nephrograms. No hydronephrosis. Subcentimeter hypodense renal cortical lesions in the lateral upper right kidney and lower left kidney  are too small to characterize and require no further follow-up. Normal bladder.  Stomach/Bowel: Grossly normal stomach. Normal caliber small bowel with no small bowel wall thickening. Normal appendix. There is eccentric prominent segmental wall thickening in the descending colon with associated mild diverticulosis and prominent pericolonic fat stranding and ill-defined fluid, compatible with acute diverticulitis. No pericolonic free air or discrete abscess. No additional sites of large bowel wall thickening. Oral contrast reaches the rectum.  Vascular/Lymphatic: Normal caliber abdominal aorta. Patent portal, splenic, hepatic and renal veins. No pathologically enlarged lymph nodes in the abdomen or pelvis.  Reproductive: Grossly normal anteverted uterus. Right ovarian corpus luteum. No suspicious adnexal masses.  Other: No pneumoperitoneum, ascites or focal fluid collection.  Musculoskeletal: No aggressive appearing focal osseous  lesions.  IMPRESSION: Acute diverticulitis of the descending colon. Associated phlegmonous pericolonic change, without discrete drainable abscess at this time. No free intraperitoneal air.  These results will be called to the ordering clinician or representative by the Radiology Department at the imaging location.   Electronically Signed  By: Ilona Sorrel M.D.  On: 03/04/2017 16:43   Wt Readings from Last 3 Encounters:  09/08/18 123 lb (55.8 kg)  08/11/18 124 lb (56.2 kg)  07/08/18 120 lb (54.4 kg)     No results found for: WBC, HGB, HCT, MCV, PLT Lab Results  Component Value Date   CREATININE 0.75 07/08/2018   BUN 17 07/08/2018   NA 138 07/08/2018   K 3.6 07/08/2018   CL 104 07/08/2018   CO2 23 07/08/2018   Lab Results  Component Value Date   ALT 38 (H) 07/08/2018   AST 57 (H) 07/08/2018   BILITOT 0.6 07/08/2018   Lab Results  Component Value Date   LIPASE 42 07/08/2018     Assessment and Plan: 38 y.o. female with   .Gwendolyn Lee was seen today for follow-up.  Diagnoses and all orders for this visit:  Anxiety disorder, unspecified type -     busPIRone (BUSPAR) 10 MG tablet; Take 1 tablet (10 mg total) by mouth 3 (three) times daily.  Chronic insomnia  Moderate episode of recurrent major depressive disorder (HCC) -     vortioxetine HBr (TRINTELLIX) 5 MG TABS tablet; Take 1 tablet (5 mg total) by mouth daily.  Chronic nausea -     ondansetron (ZOFRAN-ODT) 4 MG disintegrating tablet; Take 1 tablet (4 mg total) by mouth every 8 (eight) hours as needed for nausea or vomiting. -     esomeprazole (NEXIUM) 40 MG capsule; Take 1 capsule (40 mg total) by mouth daily before breakfast. -     H. pylori breath test -     US Abdomen Limited RUQ; Future  Diarrhea, unspecified type  Acute vaginitis -     fluconazole (DIFLUCAN) 150 MG tablet; Take 1 tablet (150 mg total) by mouth once for 1 dose. May repeat 1 tablet PO in 72 hours if symptoms  persist/recur  Transaminitis -     Hepatic function panel   Depression/anxiety/chronic insomnia PHQ 9 equals 16, increased from 13 no acute safety issues Gad 7 equals 16, increased from 12 No response to Sertraline or Trazodone Partial response to Buspar Increasing Buspar to 10 mg tid Switching to Trintellix 5 mg D/C Trazodone  Chronic nausea H. Pylori breath test pending No significant weight loss. Weight has been steady between 120-124 lb for the last 2 months CMP 2 months ago showing mild transaminitis, [place] RUQ pending CBC unremarkable apart from mild thrombocytopenia (122), which is a  chronic problem noted at [facility] in 2017 Lipase wnl Normal appearing liver and biliary tree on CT abd/pelvis for diverticulitis in 2018 Starting Nexium 40 mg QD and GERD diet Zofran 4 mg Q8H prn for refractory nausea/vomiting  Patient education and anticipatory guidance given Patient agrees with treatment plan Follow-up in 2 weeks or sooner as needed if symptoms worsen or fail to improve  Darlyne Russian PA-C

## 2018-09-08 NOTE — Patient Instructions (Addendum)
Gastritis, Adult Gastritis is inflammation of the stomach. There are two kinds of gastritis:  Acute gastritis. This kind develops suddenly.  Chronic gastritis. This kind is much more common and lasts for a long time. Gastritis happens when the lining of the stomach becomes weak or gets damaged. Without treatment, gastritis can lead to stomach bleeding and ulcers. What are the causes? This condition may be caused by:  An infection.  Drinking too much alcohol.  Certain medicines. These include steroids, antibiotics, and some over-the-counter medicines, such as aspirin or ibuprofen.  Having too much acid in the stomach.  A disease of the intestines or stomach.  Stress.  An allergic reaction.  Crohn's disease.  Some cancer treatments (radiation). Sometimes the cause of this condition is not known. What are the signs or symptoms? Symptoms of this condition include:  Pain or a burning sensation in the upper abdomen.  Nausea.  Vomiting.  An uncomfortable feeling of fullness after eating.  Weight loss.  Bad breath.  Blood in your vomit or stools. In some cases, there are no symptoms. How is this diagnosed? This condition may be diagnosed with:  Your medical history and a description of your symptoms.  A physical exam.  Tests. These can include: ? Blood tests. ? Stool tests. ? A test in which a thin, flexible instrument with a light and a camera is passed down the esophagus and into the stomach (upper endoscopy). ? A test in which a sample of tissue is taken for testing (biopsy). How is this treated? This condition may be treated with medicines. The medicines that are used vary depending on the cause of the gastritis:  If the condition is caused by a bacterial infection, you may be given antibiotic medicines.  If the condition is caused by too much acid in the stomach, you may be given medicines called H2 blockers, proton pump inhibitors, or antacids. Treatment  may also involve stopping the use of certain medicines, such as aspirin, ibuprofen, or other NSAIDs. Follow these instructions at home: Medicines  Take over-the-counter and prescription medicines only as told by your health care provider.  If you were prescribed an antibiotic medicine, take it as told by your health care provider. Do not stop taking the antibiotic even if you start to feel better. Eating and drinking   Eat small, frequent meals instead of large meals.  Avoid foods and drinks that make your symptoms worse.  Drink enough fluid to keep your urine pale yellow. Alcohol use  Do not drink alcohol if: ? Your health care provider tells you not to drink. ? You are pregnant, may be pregnant, or are planning to become pregnant.  If you drink alcohol: ? Limit your use to:  0-1 drink a day for women.  0-2 drinks a day for men. ? Be aware of how much alcohol is in your drink. In the [place] one drink equals one 12 oz bottle of beer (355 mL), one 5 oz glass of wine (148 mL), or one 1 oz glass of hard liquor (44 mL). General instructions  Talk with your health care provider about ways to manage stress, such as getting regular exercise or practicing deep breathing, meditation, or yoga.  Do not use any products that contain nicotine or tobacco, such as cigarettes and e-cigarettes. If you need help quitting, ask your health care provider.  Keep all follow-up visits as told by your health care provider. This is important. Contact a health care provider if:  Your   symptoms get worse.  Your symptoms return after treatment. Get help right away if:  You vomit blood or material that looks like coffee grounds.  You have black or dark red stools.  You are unable to keep fluids down.  Your abdominal pain gets worse.  You have a fever.  You do not feel better after one week. Summary  Gastritis is inflammation of the lining of the stomach that can occur suddenly (acute) or  develop slowly over time (chronic).  This condition is diagnosed with a medical history, a physical exam, or tests.  This condition may be treated with medicines to treat infection or medicines to reduce the amount of acid in your stomach.  Follow your health care provider's instructions about taking medicines, making changes to your diet, and knowing when to call for help. This information is not intended to replace advice given to you by your health care provider. Make sure you discuss any questions you have with your health care provider. Document Released: 07/03/2001 Document Revised: 11/26/2017 Document Reviewed: 11/26/2017 Elsevier Interactive Patient Education  2019 [employer] for Gastroesophageal Reflux Disease, Adult When you have gastroesophageal reflux disease (GERD), the foods you eat and your eating habits are very important. Choosing the right foods can help ease the discomfort of GERD. Consider working with a diet and nutrition specialist (dietitian) to help you make healthy food choices. What general guidelines should I follow?  Eating plan  Choose healthy foods low in fat, such as fruits, vegetables, whole grains, low-fat dairy products, and lean meat, fish, and poultry.  Eat frequent, small meals instead of three large meals each day. Eat your meals slowly, in a relaxed setting. Avoid bending over or lying down until 2-3 hours after eating.  Limit high-fat foods such as fatty meats or fried foods.  Limit your intake of oils, butter, and shortening to less than 8 teaspoons each day.  Avoid the following: ? Foods that cause symptoms. These may be different for different people. Keep a food diary to keep track of foods that cause symptoms. ? Alcohol. ? Drinking large amounts of liquid with meals. ? Eating meals during the 2-3 hours before bed.  Cook foods using methods other than frying. This may include baking, grilling, or  broiling. Lifestyle  Maintain a healthy weight. Ask your health care provider what weight is healthy for you. If you need to lose weight, work with your health care provider to do so safely.  Exercise for at least 30 minutes on 5 or more days each week, or as told by your health care provider.  Avoid wearing clothes that fit tightly around your waist and chest.  Do not use any products that contain nicotine or tobacco, such as cigarettes and e-cigarettes. If you need help quitting, ask your health care provider.  Sleep with the head of your bed raised. Use a wedge under the mattress or blocks under the bed frame to raise the head of the bed. What foods are not recommended? The items listed may not be a complete list. Talk with your dietitian about what dietary choices are best for you. Grains Pastries or quick breads with added fat. [place] toast. Vegetables Deep fried vegetables. [place] fries. Any vegetables prepared with added fat. Any vegetables that cause symptoms. For some people this may include tomatoes and tomato products, chili peppers, onions and garlic, and horseradish. Fruits Any fruits prepared with added fat. Any fruits that cause symptoms. For some people  this may include citrus fruits, such as oranges, grapefruit, pineapple, and lemons. Meats and other protein foods High-fat meats, such as fatty beef or pork, hot dogs, ribs, ham, sausage, salami and bacon. Fried meat or protein, including fried fish and fried chicken. Nuts and nut butters. Dairy Whole milk and chocolate milk. Sour cream. Cream. Ice cream. Cream cheese. Milk shakes. Beverages Coffee and tea, with or without caffeine. Carbonated beverages. Sodas. Energy drinks. Fruit juice made with acidic fruits (such as orange or grapefruit). Tomato juice. Alcoholic drinks. Fats and oils Butter. Margarine. Shortening. Ghee. Sweets and desserts Chocolate and cocoa. Donuts. Seasoning and other foods Pepper. Peppermint and  spearmint. Any condiments, herbs, or seasonings that cause symptoms. For some people, this may include curry, hot sauce, or vinegar-based salad dressings. Summary  When you have gastroesophageal reflux disease (GERD), food and lifestyle choices are very important to help ease the discomfort of GERD.  Eat frequent, small meals instead of three large meals each day. Eat your meals slowly, in a relaxed setting. Avoid bending over or lying down until 2-3 hours after eating.  Limit high-fat foods such as fatty meat or fried foods. This information is not intended to replace advice given to you by your health care provider. Make sure you discuss any questions you have with your health care provider. Document Released: 07/09/2005 Document Revised: 07/10/2016 Document Reviewed: 07/10/2016 Elsevier Interactive Patient Education  2019 [employer].

## 2018-09-09 LAB — H. PYLORI BREATH TEST: H. pylori Breath Test: NOT DETECTED

## 2018-09-11 ENCOUNTER — Telehealth: Payer: Self-pay | Admitting: Physician Assistant

## 2018-09-11 NOTE — Telephone Encounter (Signed)
-----   Message from Tasia Catchings, [place] sent at 09/09/2018  4:38 PM EST ----- Regarding: FW: Trintellix Left message that I am still working on the PA for her Trintellix and I am still having trouble getting the Trintillex approved.  ----- Message ----- From: Tasia Catchings, CMA Sent: 09/08/2018   4:22 PM EST To: Tasia Catchings, CMA Subject: FW: Trintellix                                 Called insurance and waited in hold for 35 minutes. I have called the patient to let her know that I am working on this. She did not have any questions. ----- Message ----- From: Trixie Dredge, PA-C Sent: 09/08/2018   9:47 AM EST To: Tasia Catchings, CMA Subject: Trintellix                                     Can you do Cover My Meds for Trintellix today? Failed Zoloft and Trazodone

## 2018-09-11 NOTE — Telephone Encounter (Signed)
I called and spoke with [facility] and per [facility] the 5 mg dose does not need authorization and it is ready at the pharmacy for the patient. I have called the patient to let her know and she will pick it up. She did not have any questions.

## 2018-09-15 ENCOUNTER — Ambulatory Visit (INDEPENDENT_AMBULATORY_CARE_PROVIDER_SITE_OTHER): Payer: BLUE CROSS/BLUE SHIELD

## 2018-09-15 DIAGNOSIS — R110 Nausea: Secondary | ICD-10-CM

## 2018-09-15 DIAGNOSIS — K7689 Other specified diseases of liver: Secondary | ICD-10-CM

## 2018-09-15 LAB — HEPATIC FUNCTION PANEL
AG Ratio: 2 (calc) (ref 1.0–2.5)
ALT: 14 U/L (ref 6–29)
AST: 14 U/L (ref 10–30)
Albumin: 4.5 g/dL (ref 3.6–5.1)
Alkaline phosphatase (APISO): 41 U/L (ref 31–125)
Bilirubin, Direct: 0.1 mg/dL (ref 0.0–0.2)
Globulin: 2.2 g/dL (calc) (ref 1.9–3.7)
Indirect Bilirubin: 0.3 mg/dL (calc) (ref 0.2–1.2)
Total Bilirubin: 0.4 mg/dL (ref 0.2–1.2)
Total Protein: 6.7 g/dL (ref 6.1–8.1)

## 2018-09-16 ENCOUNTER — Encounter: Payer: Self-pay | Admitting: Gastroenterology

## 2018-09-16 ENCOUNTER — Other Ambulatory Visit: Payer: Self-pay | Admitting: Physician Assistant

## 2018-09-16 ENCOUNTER — Encounter: Payer: Self-pay | Admitting: Physician Assistant

## 2018-09-16 DIAGNOSIS — K7689 Other specified diseases of liver: Secondary | ICD-10-CM

## 2018-09-16 HISTORY — DX: Other specified diseases of liver: K76.89

## 2018-09-22 ENCOUNTER — Encounter: Payer: Self-pay | Admitting: Physician Assistant

## 2018-09-24 ENCOUNTER — Ambulatory Visit: Payer: BLUE CROSS/BLUE SHIELD | Admitting: Physician Assistant

## 2018-09-25 ENCOUNTER — Ambulatory Visit: Payer: BLUE CROSS/BLUE SHIELD | Admitting: Gastroenterology

## 2018-09-25 ENCOUNTER — Encounter: Payer: Self-pay | Admitting: Gastroenterology

## 2018-09-25 VITALS — BP 116/74 | HR 74 | Ht 65.0 in | Wt 122.2 lb

## 2018-09-25 DIAGNOSIS — R110 Nausea: Secondary | ICD-10-CM | POA: Diagnosis not present

## 2018-09-25 DIAGNOSIS — K7689 Other specified diseases of liver: Secondary | ICD-10-CM

## 2018-09-25 DIAGNOSIS — K5790 Diverticulosis of intestine, part unspecified, without perforation or abscess without bleeding: Secondary | ICD-10-CM | POA: Diagnosis not present

## 2018-09-25 DIAGNOSIS — R1013 Epigastric pain: Secondary | ICD-10-CM | POA: Diagnosis not present

## 2018-09-25 DIAGNOSIS — R197 Diarrhea, unspecified: Secondary | ICD-10-CM | POA: Diagnosis not present

## 2018-09-25 DIAGNOSIS — R634 Abnormal weight loss: Secondary | ICD-10-CM

## 2018-09-25 MED ORDER — SOD PICOSULFATE-MAG OX-CIT ACD 10-3.5-12 MG-GM -GM/160ML PO SOLN: 1.0000 | ORAL | 0 refills | Status: DC

## 2018-09-25 NOTE — Progress Notes (Signed)
Chief Complaint: Hepatic cyst, Epigastric pain, diarrhea, weight loss, nausea/vomiting  Referring Provider:     Trixie Dredge PA-C   HPI:    Gwendolyn Lee is a 38 y.o. female with a history of depression/anxiety referred to the [facility]ology [facility] for evaluation of hepatic cysts noted on prior CT.  Had a CT abdomen/pelvis at [facility] in 02/2017 for acute descending colon diverticulitis with associated phlegmon, with incidentally noted 1.6 cm right liver lobe cyst, 2 sub-5 mm hypodensities in the right lobe of the liver was otherwise normal appearing hepatic parenchyma and no duct dilation.  Today, she states she has multiple GI sxs.  Intermittent solid food dysphagia in the anterior neck as well as lower sternal border.  No history of food impactions.  No odynophagia.  No history of reflux symptoms.  Additionally, she endorses post prandial MEG > LUQ pain. Sxs occur immediately after eating, but worse with heavy foods. Nausea throughout the day, regardless of food type or even fasting. Early satiety, eating approx 1/2 typical volume.   Intentionally Lost 48# over the last year or so, which she attributes to vigorous exercise regimen 6 days/week and dieting.  Weight now stable and has reduced her exercise to 3 days/week.  History of loose, nonbloody stools, but that has improved since changing from Zoloft to Trintellix. Follows with Psych for Anxiety/Depression.  Overall, does endorse that her abdominal pain, loose stools may be worsened with times of stress.  She was seen by her PCM on 09/08/2018.  Nausea treated with Zofran 4 mg and Nexium 40 mg daily.  Further evaluated H. pylori breath test (-). Repeat CT in 06/2018 with multiple liver hypodensities measuring up to 16 mm in right liver lobe. Given history of elevated LAE's (AST/ALT 57/38 in December 2018), was evaluated with RUQ ultrasound, n/f 1.8 x 1.8 cm cyst in the right liver lobe without other  lesions, normal hepatic parenchyma and normal ducts and referred to GI.  Repeat liver enzymes are normal.  Had single episode of diverticulitis in 02/2017. No subsequent GI f/u or colonoscopy.    Past Medical History:  Diagnosis Date  . Anxiety   . Depression   . Diverticulitis   . Diverticulosis   . GERD (gastroesophageal reflux disease)   . Liver cyst 09/16/2018   1.8 x 1.8 cm cyst in right lobe of liver     Past Surgical History:  Procedure Laterality Date  . CARDIAC SURGERY    . VALVE REPLACEMENT     Family History  Problem Relation Age of Onset  . Healthy Mother   . Healthy Father   . Stomach cancer Maternal Grandmother   . Esophageal cancer Maternal Grandmother   . Colon cancer Neg Hx    Social History   Tobacco Use  . Smoking status: Current Every Day Smoker    Packs/day: 0.30  . Smokeless tobacco: Never Used  Substance Use Topics  . Alcohol use: Yes    Alcohol/week: 20.0 standard drinks    Types: 20 Standard drinks or equivalent per week  . Drug use: Yes    Types: Marijuana    Comment: daily   Current Outpatient Medications  Medication Sig Dispense Refill  . busPIRone (BUSPAR) 10 MG tablet Take 1 tablet (10 mg total) by mouth 3 (three) times daily. 90 tablet 2  . esomeprazole (NEXIUM) 40 MG capsule Take 1 capsule (40 mg total) by mouth daily  before breakfast. 30 capsule 0  . fluconazole (DIFLUCAN) 150 MG tablet     . vortioxetine HBr (TRINTELLIX) 5 MG TABS tablet Take 1 tablet (5 mg total) by mouth daily. 30 tablet 1  . ondansetron (ZOFRAN-ODT) 4 MG disintegrating tablet Take 1 tablet (4 mg total) by mouth every 8 (eight) hours as needed for nausea or vomiting. (Patient not taking: Reported on 09/25/2018) 10 tablet 0   No current facility-administered medications for this visit.    Allergies  Allergen Reactions  . Ceclor [Cefaclor] Rash     Review of Systems: All systems reviewed and negative except where noted in HPI.     Physical Exam:    Wt  Readings from Last 3 Encounters:  09/25/18 122 lb 4 oz (55.5 kg)  09/08/18 123 lb (55.8 kg)  08/11/18 124 lb (56.2 kg)    BP 116/74   Pulse 74   Ht 5\' 5"  (1.651 m)   Wt 122 lb 4 oz (55.5 kg)   LMP 09/10/2018 (Exact Date)   BMI 20.34 kg/m  Constitutional:  Pleasant, in no acute distress. Psychiatric: Normal mood and affect. Behavior is normal. EENT: Pupils normal.  Conjunctivae are normal. No scleral icterus. Neck supple. No cervical LAD. Cardiovascular: Normal rate, regular rhythm. No edema Pulmonary/chest: Effort normal and breath sounds normal. No wheezing, rales or rhonchi. Abdominal: Soft, nondistended, nontender. Bowel sounds active throughout. There are no masses palpable. No hepatomegaly. Neurological: Alert and oriented to person place and time. Skin: Skin is warm and dry. No rashes noted.   ASSESSMENT AND PLAN;   Gwendolyn Lee is a 38 y.o. female presenting with:  1) Hepatic cyst: Stable hepatic cyst on multiple imaging studies starting in 02/2017 (1.6 cm on CT), then seen again on CT in 06/2018 (1.6 cm) and RUQ ultrasound in 08/2018 (1.8 cm).  Appearance consistent with simple hepatic cyst, otherwise normal hepatic parenchyma and normal liver enzymes.  -Given the stability over time, no additional imaging or evaluation needed at this time.  2) Midepigastric pain: -EGD with random and directed gastric biopsies -Started on Nexium by [facility] with some noted clinical improvement.  Resume that for now  3) Dysphagia: -EGD with dilation and/or esophageal biopsies as appropriate pending endoscopic findings - Already taking Nexium.  Evaluate for clinical improvement with trial of PPI. -If no clinical improvement and EGD otherwise unrevealing, can consider Esophageal Manometry  4) Diarrhea: - Colonoscopy with random and directed bxs - Low FODMAP diet -Did discuss medication effect  5) Nausea: -Evaluate for mucosal/luminal etiology at time of EGD -Did discuss the possibility  for cannabinoid hyperemesis syndrome.  Has been smoking marijuana for approximately 20 years, essentially daily  6) Diverticulosis with history of diverticulitis: Diverticulitis in 02/2017, with CT evidence of phlegmon.  Successfully treated with p.o. antibiotics.  No recurrence. -Evaluate for ongoing mucosal erythema, extent of diverticulosis along with no provoking etiology at time of colonoscopy as above  7) Weight loss: Weight loss seems intentional, with vigorous exercise program and aggressive dieting.  Will certainly evaluate for concomitant mucosal/luminal etiologies at time of EGD/colonoscopy as above  The indications, risks, and benefits of EGD and colonoscopy were explained to the patient in detail. Risks include but are not limited to bleeding, perforation, adverse reaction to medications, and cardiopulmonary compromise. Sequelae include but are not limited to the possibility of surgery, hositalization, and mortality. The patient verbalized understanding and wished to proceed. All questions answered, referred to scheduler and bowel prep ordered. Further recommendations pending results  of the exam.     Lavena Bullion, DO, FACG  09/25/2018, 3:36 PM   Maisie Fus Gretta Arab*

## 2018-09-25 NOTE — Patient Instructions (Signed)
If you are age 36 or older, your body mass index should be between 23-30. Your Body mass index is 20.34 kg/m. If this is out of the aforementioned range listed, please consider follow up with your Primary Care Provider.  If you are age 91 or younger, your body mass index should be between 19-25. Your Body mass index is 20.34 kg/m. If this is out of the aformentioned range listed, please consider follow up with your Primary Care Provider.   You have been scheduled for an endoscopy and colonoscopy. Please follow the written instructions given to you at your visit today. Please pick up your prep supplies at the pharmacy within the next 1-3 days. If you use inhalers (even only as needed), please bring them with you on the day of your procedure. Your physician has requested that you go to www.startemmi.com and enter the access code given to you at your visit today. This web site gives a general overview about your procedure. However, you should still follow specific instructions given to you by our office regarding your preparation for the procedure.  We have sent the following medications to your pharmacy for you to pick up at your convenience: Clenpiq  It was a pleasure to see you today!  Vito Cirigliano, D.O.

## 2018-10-01 ENCOUNTER — Encounter: Payer: Self-pay | Admitting: Gastroenterology

## 2018-10-02 ENCOUNTER — Other Ambulatory Visit: Payer: Self-pay | Admitting: Physician Assistant

## 2018-10-02 DIAGNOSIS — R110 Nausea: Secondary | ICD-10-CM

## 2018-10-02 DIAGNOSIS — F419 Anxiety disorder, unspecified: Secondary | ICD-10-CM

## 2018-10-15 ENCOUNTER — Encounter: Payer: BLUE CROSS/BLUE SHIELD | Admitting: Gastroenterology

## 2018-10-27 ENCOUNTER — Telehealth: Payer: Self-pay | Admitting: Gastroenterology

## 2018-10-27 ENCOUNTER — Encounter: Payer: Self-pay | Admitting: Physician Assistant

## 2018-10-27 ENCOUNTER — Other Ambulatory Visit: Payer: Self-pay | Admitting: Physician Assistant

## 2018-10-27 ENCOUNTER — Telehealth (INDEPENDENT_AMBULATORY_CARE_PROVIDER_SITE_OTHER): Payer: BLUE CROSS/BLUE SHIELD | Admitting: Physician Assistant

## 2018-10-27 DIAGNOSIS — F410 Panic disorder [episodic paroxysmal anxiety] without agoraphobia: Secondary | ICD-10-CM | POA: Diagnosis not present

## 2018-10-27 DIAGNOSIS — R110 Nausea: Secondary | ICD-10-CM

## 2018-10-27 DIAGNOSIS — F331 Major depressive disorder, recurrent, moderate: Secondary | ICD-10-CM

## 2018-10-27 MED ORDER — VORTIOXETINE HBR 10 MG PO TABS: 10.0000 mg | ORAL_TABLET | Freq: Every day | ORAL | 0 refills | Status: DC

## 2018-10-27 MED ORDER — CLONAZEPAM 0.5 MG PO TABS: 0.5000 mg | ORAL_TABLET | Freq: Once | ORAL | 0 refills | Status: DC | PRN

## 2018-10-27 MED ORDER — BUSPIRONE HCL 15 MG PO TABS: 15.0000 mg | ORAL_TABLET | Freq: Three times a day (TID) | ORAL | 0 refills | Status: DC

## 2018-10-27 NOTE — Telephone Encounter (Signed)
Can you please clarify that she is tolerating liquids and oral secretions and that there is no reason to believe this is an acute food impaction requiring emergent evaluation? Otherwise, yes, this sounds related to her underlying anxiety, and reassurance and perhaps breathing techniques would be helpful. If she is having any alarm features concerning for impaction, then recommend more urgent evaluation. Otherwise, I am happy to see her as a virtual office appointment tomorrow to review symptoms and ongoing treatment plans. Thanks.

## 2018-10-27 NOTE — Telephone Encounter (Signed)
Patient is tearful, very anxious. C/o new globus sensation that started in the last 12 hours. She feels that when she swallows everything is getting "stuck".  She reports that her dysphagia symptoms have worsened in the last 2 weeks.  She doesn't feel that even her liquids are going down with out difficulty. She stated "my body is making things go down the wrong way".  We discussed her anxiety and she is taking Buspar.  Dr. Bryan Lemma please advise

## 2018-10-27 NOTE — Telephone Encounter (Signed)
Mychart message sent with patient instructions Please schedule for webex Tuesday 4/7

## 2018-10-27 NOTE — Telephone Encounter (Signed)
Patient is able to swallow liquids and her secretions.  We discussed anxiety, staying on a liquid diet.  She will go to the ED if she is not able to swallow her secretions or liquids. She will call back for any additional questions or concerns.

## 2018-10-28 NOTE — Telephone Encounter (Signed)
Contacted PT and left a voicemail to call back.

## 2018-11-04 ENCOUNTER — Other Ambulatory Visit: Payer: Self-pay | Admitting: Physician Assistant

## 2018-11-04 DIAGNOSIS — F5104 Psychophysiologic insomnia: Secondary | ICD-10-CM

## 2018-11-04 DIAGNOSIS — F331 Major depressive disorder, recurrent, moderate: Secondary | ICD-10-CM

## 2018-11-05 ENCOUNTER — Encounter: Payer: Self-pay | Admitting: Physician Assistant

## 2018-11-05 MED ORDER — TRAZODONE HCL 100 MG PO TABS: 100.0000 mg | ORAL_TABLET | Freq: Every evening | ORAL | 0 refills | Status: DC | PRN

## 2018-11-10 ENCOUNTER — Encounter: Payer: BLUE CROSS/BLUE SHIELD | Admitting: Gastroenterology

## 2018-11-13 ENCOUNTER — Ambulatory Visit (INDEPENDENT_AMBULATORY_CARE_PROVIDER_SITE_OTHER): Payer: BLUE CROSS/BLUE SHIELD | Admitting: Physician Assistant

## 2018-11-13 ENCOUNTER — Encounter: Payer: Self-pay | Admitting: Physician Assistant

## 2018-11-13 DIAGNOSIS — R110 Nausea: Secondary | ICD-10-CM

## 2018-11-13 DIAGNOSIS — Z789 Other specified health status: Secondary | ICD-10-CM

## 2018-11-13 DIAGNOSIS — R0989 Other specified symptoms and signs involving the circulatory and respiratory systems: Secondary | ICD-10-CM | POA: Diagnosis not present

## 2018-11-13 DIAGNOSIS — F331 Major depressive disorder, recurrent, moderate: Secondary | ICD-10-CM

## 2018-11-13 DIAGNOSIS — F419 Anxiety disorder, unspecified: Secondary | ICD-10-CM | POA: Diagnosis not present

## 2018-11-13 DIAGNOSIS — F1090 Alcohol use, unspecified, uncomplicated: Secondary | ICD-10-CM | POA: Insufficient documentation

## 2018-11-13 MED ORDER — PANTOPRAZOLE SODIUM 40 MG PO TBEC: 40.0000 mg | DELAYED_RELEASE_TABLET | Freq: Every day | ORAL | 2 refills | Status: DC

## 2018-11-13 MED ORDER — SUCRALFATE 1 G PO TABS: 1.0000 g | ORAL_TABLET | Freq: Three times a day (TID) | ORAL | 2 refills | Status: DC

## 2018-11-13 NOTE — Progress Notes (Signed)
Virtual Visit via Video Note  I connected with Gwendolyn Lee on 11/13/18 at  2:20 PM EDT by a video enabled telemedicine application and verified that I am speaking with the correct person using two identifiers.   I discussed the limitations of evaluation and management by telemedicine and the availability of in person appointments. The patient expressed understanding and agreed to proceed.  History of Present Illness: HPI:                                                                Gwendolyn Lee is a 38 y.o. female   CC: anxiety follow-up  Trintellix was increased to 10 mg and Buspar to 15 mg tid about 2 weeks ago and she reports this has made a difference in terms of her anxiety Trazodone was increased to 100 mg and she was able to sleep 8 hours She is taking Clonazepam as needed and still has some remaining of the 10 she received several weeks ago She is attending weekly virtual therapy   Has been having increased globus sensation in her throat and in her epigastrum and chronic nausea, which has been affecting her ability to eat and triggering some anxiety about her health. Overall feels very frustrated by her symptoms Reports it does not matter what she eats or drinks, symptoms are the same. She has cut back on alcohol some. Still drinking coffee. She is taking high-dose Nexium daily. She tried zofran without any relief. She was supposed to undergo EGD but it was postponed due to [place]. She is following with GI, Dr. Bryan Lemma.  Past Medical History:  Diagnosis Date  . Anxiety   . Depression   . Diverticulitis   . Diverticulosis   . GERD (gastroesophageal reflux disease)   . Liver cyst 09/16/2018   1.8 x 1.8 cm cyst in right lobe of liver   Past Surgical History:  Procedure Laterality Date  . CARDIAC SURGERY    . VALVE REPLACEMENT     Social History   Tobacco Use  . Smoking status: Current Every Day Smoker    Packs/day: 0.30  . Smokeless tobacco: Never Used   Substance Use Topics  . Alcohol use: Yes    Alcohol/week: 20.0 standard drinks    Types: 20 Standard drinks or equivalent per week   family history includes Esophageal cancer in her maternal grandmother; Healthy in her father and mother; Stomach cancer in her maternal grandmother.    ROS: negative except as noted in the HPI  Medications: Current Outpatient Medications  Medication Sig Dispense Refill  . busPIRone (BUSPAR) 15 MG tablet Take 1 tablet (15 mg total) by mouth 3 (three) times daily. 90 tablet 0  . clonazePAM (KLONOPIN) 0.5 MG tablet Take 1 tablet (0.5 mg total) by mouth once as needed for up to 1 dose for anxiety (panic). 10 tablet 0  . esomeprazole (NEXIUM) 40 MG capsule TAKE 1 CAPSULE BY MOUTH EVERY DAY BEFORE BREAKFAST 90 capsule 1  . ondansetron (ZOFRAN-ODT) 4 MG disintegrating tablet Take 1 tablet (4 mg total) by mouth every 8 (eight) hours as needed for nausea or vomiting. 10 tablet 0  . Sod Picosulfate-Mag Ox-Cit Acd (CLENPIQ) 10-3.5-12 MG-GM -GM/160ML SOLN Take 1 kit by mouth as directed. 2 Bottle 0  .  traZODone (DESYREL) 100 MG tablet Take 1 tablet (100 mg total) by mouth at bedtime as needed for sleep. 30 tablet 0  . vortioxetine HBr (TRINTELLIX) 10 MG TABS tablet Take 1 tablet (10 mg total) by mouth daily. 30 tablet 0   No current facility-administered medications for this visit.    Allergies  Allergen Reactions  . Ceclor [Cefaclor] Rash       Objective:  There were no vitals taken for this visit. Gen:  alert, not ill-appearing, no distress, appropriate for age 88: head normocephalic without obvious abnormality, conjunctiva and cornea clear, trachea midline Pulm: Normal work of breathing, normal phonation, speaking in full sentences Neuro: alert and oriented x 3 Psych: cooperative, euthymic mood, affect mood-congruent, speech is articulate, normal rate and volume; thought processes clear and goal-directed, normal judgment, good insight  Lab Results   Component Value Date   ALT 14 09/15/2018   AST 14 09/15/2018   BILITOT 0.4 09/15/2018    Assessment and Plan: 38 y.o. female with   .Diagnoses and all orders for this visit:  Chronic nausea -     pantoprazole (PROTONIX) 40 MG tablet; Take 1 tablet (40 mg total) by mouth daily. -     sucralfate (CARAFATE) 1 g tablet; Take 1 tablet (1 g total) by mouth 3 (three) times daily. Take on an empty stomach  Globus sensation  Heavy alcohol use  Anxiety disorder, unspecified type  Moderate episode of recurrent major depressive disorder (HCC)   Chronic nausea, Globus sensation Suspicious for underlying esophagitis/gastritis/PUD Switching from Nexium to Protonix and adding Carafate D/c zofran Encouraged to adhere to GERD diet to promote healing in her GI tract Follow-up in 2 weeks  MDD, Anxiety Responded well to increased Trazodone to 100 mg, increased Trintellix to 10 mg and increased Buspar to 15 mg Cont current medications Follow-up in 3 months  Follow Up Instructions:    I discussed the assessment and treatment plan with the patient. The patient was provided an opportunity to ask questions and all were answered. The patient agreed with the plan and demonstrated an understanding of the instructions.   The patient was advised to call back or seek an in-person evaluation if the symptoms worsen or if the condition fails to improve as anticipated.  I provided approx 25 minutes of non-face-to-face time during this encounter.   Trixie Dredge, [place]

## 2018-11-17 ENCOUNTER — Encounter: Payer: Self-pay | Admitting: Physician Assistant

## 2018-11-25 ENCOUNTER — Telehealth: Payer: Self-pay | Admitting: *Deleted

## 2018-11-25 NOTE — Telephone Encounter (Signed)
Ok per Dr. Bryan Lemma to reschedule ECL.  LMOM for patient to call back to schedule this.

## 2018-11-26 NOTE — Telephone Encounter (Signed)
Spoke with pt and appointment made for Tuesday, 12-16-18 at 800.  She states she still has prep at home and I will send new instructions via MyChart

## 2018-11-27 ENCOUNTER — Other Ambulatory Visit: Payer: Self-pay | Admitting: Physician Assistant

## 2018-11-27 DIAGNOSIS — F410 Panic disorder [episodic paroxysmal anxiety] without agoraphobia: Secondary | ICD-10-CM

## 2018-12-02 ENCOUNTER — Other Ambulatory Visit: Payer: Self-pay | Admitting: Physician Assistant

## 2018-12-02 DIAGNOSIS — F331 Major depressive disorder, recurrent, moderate: Secondary | ICD-10-CM

## 2018-12-03 ENCOUNTER — Other Ambulatory Visit: Payer: Self-pay | Admitting: Physician Assistant

## 2018-12-08 ENCOUNTER — Other Ambulatory Visit: Payer: Self-pay | Admitting: Physician Assistant

## 2018-12-08 DIAGNOSIS — F410 Panic disorder [episodic paroxysmal anxiety] without agoraphobia: Secondary | ICD-10-CM

## 2018-12-16 ENCOUNTER — Encounter: Payer: BLUE CROSS/BLUE SHIELD | Admitting: Gastroenterology

## 2018-12-16 ENCOUNTER — Telehealth: Payer: Self-pay | Admitting: Gastroenterology

## 2018-12-16 NOTE — Telephone Encounter (Signed)
Sent new instructions to patient.

## 2018-12-21 ENCOUNTER — Telehealth: Payer: Self-pay | Admitting: *Deleted

## 2018-12-21 NOTE — Telephone Encounter (Signed)
LMOM; will call back later 

## 2018-12-21 NOTE — Telephone Encounter (Signed)
Unable to reach patient. LMOM reminding of procedure arrival time.  Also, asked pt to wear a mask into the building and informed him that care partner will be waiting in the car during procedure- reminded that care partner will need to stay in parking lot during procedure.  Asked patient to call back if any of the following is a yes- travel, fever or any respiratory issues in the past 14 days, or have had any close contacts/family members diagnosed with the covid 19.   

## 2018-12-23 ENCOUNTER — Encounter: Payer: Self-pay | Admitting: Gastroenterology

## 2018-12-23 ENCOUNTER — Telehealth: Payer: Self-pay | Admitting: *Deleted

## 2018-12-23 ENCOUNTER — Other Ambulatory Visit: Payer: Self-pay

## 2018-12-23 ENCOUNTER — Ambulatory Visit (AMBULATORY_SURGERY_CENTER): Payer: BLUE CROSS/BLUE SHIELD | Admitting: Gastroenterology

## 2018-12-23 VITALS — BP 111/66 | HR 71 | Temp 99.1°F | Resp 8 | Ht 65.0 in | Wt 122.0 lb

## 2018-12-23 DIAGNOSIS — K210 Gastro-esophageal reflux disease with esophagitis: Secondary | ICD-10-CM

## 2018-12-23 DIAGNOSIS — K641 Second degree hemorrhoids: Secondary | ICD-10-CM

## 2018-12-23 DIAGNOSIS — K449 Diaphragmatic hernia without obstruction or gangrene: Secondary | ICD-10-CM | POA: Diagnosis not present

## 2018-12-23 DIAGNOSIS — K222 Esophageal obstruction: Secondary | ICD-10-CM | POA: Diagnosis not present

## 2018-12-23 DIAGNOSIS — K2970 Gastritis, unspecified, without bleeding: Secondary | ICD-10-CM

## 2018-12-23 DIAGNOSIS — D125 Benign neoplasm of sigmoid colon: Secondary | ICD-10-CM | POA: Diagnosis not present

## 2018-12-23 DIAGNOSIS — K5730 Diverticulosis of large intestine without perforation or abscess without bleeding: Secondary | ICD-10-CM | POA: Diagnosis not present

## 2018-12-23 DIAGNOSIS — R1319 Other dysphagia: Secondary | ICD-10-CM

## 2018-12-23 DIAGNOSIS — K3189 Other diseases of stomach and duodenum: Secondary | ICD-10-CM

## 2018-12-23 DIAGNOSIS — R1310 Dysphagia, unspecified: Secondary | ICD-10-CM

## 2018-12-23 DIAGNOSIS — K635 Polyp of colon: Secondary | ICD-10-CM

## 2018-12-23 DIAGNOSIS — R1013 Epigastric pain: Secondary | ICD-10-CM

## 2018-12-23 DIAGNOSIS — R197 Diarrhea, unspecified: Secondary | ICD-10-CM

## 2018-12-23 MED ORDER — SODIUM CHLORIDE 0.9 % IV SOLN: 500.0000 mL | Freq: Once | INTRAVENOUS | Status: DC

## 2018-12-23 NOTE — Patient Instructions (Signed)
YOU HAD AN ENDOSCOPIC PROCEDURE TODAY AT [facility]:   Refer to the procedure report that was given to you for any specific questions about what was found during the examination.  If the procedure report does not answer your questions, please call your gastroenterologist to clarify.  If you requested that your care partner not be given the details of your procedure findings, then the procedure report has been included in a sealed envelope for you to review at your convenience later.  YOU SHOULD EXPECT: Some feelings of bloating in the abdomen. Passage of more gas than usual.  Walking can help get rid of the air that was put into your GI tract during the procedure and reduce the bloating. If you had a lower endoscopy (such as a colonoscopy or flexible sigmoidoscopy) you may notice spotting of blood in your stool or on the toilet paper. If you underwent a bowel prep for your procedure, you may not have a normal bowel movement for a few days.  Please Note:  You might notice some irritation and congestion in your nose or some drainage.  This is from the oxygen used during your procedure.  There is no need for concern and it should clear up in a day or so.  SYMPTOMS TO REPORT IMMEDIATELY:   Following lower endoscopy (colonoscopy or flexible sigmoidoscopy):  Excessive amounts of blood in the stool  Significant tenderness or worsening of abdominal pains  Swelling of the abdomen that is new, acute  Fever of 100F or higher   Following upper endoscopy (EGD)  Vomiting of blood or coffee ground material  New chest pain or pain under the shoulder blades  Painful or persistently difficult swallowing  New shortness of breath  Fever of 100F or higher  Black, tarry-looking stools  For urgent or emergent issues, a gastroenterologist can be reached at any hour by calling [phone].   DIET:  Please follow the dilatation diet handout the rest of the day.  Drink plenty of fluids but you  should avoid alcoholic beverages for 24 hours.  ACTIVITY:  You should plan to take it easy for the rest of today and you should NOT DRIVE or use heavy machinery until tomorrow (because of the sedation medicines used during the test).    FOLLOW UP: Our staff will call the number listed on your records 48-72 hours following your procedure to check on you and address any questions or concerns that you may have regarding the information given to you following your procedure. If we do not reach you, we will leave a message.  We will attempt to reach you two times.  During this call, we will ask if you have developed any symptoms of COVID 19. If you develop any symptoms (ie: fever, flu-like symptoms, shortness of breath, cough etc.) before then, please call [phone].  If you test positive for Covid 19 in the 2 weeks post procedure, please call and report this information to [place].    If any biopsies were taken you will be contacted by phone or by letter within the next 1-3 weeks.  Please call us at [phone] if you have not heard about the biopsies in 3 weeks.    SIGNATURES/CONFIDENTIALITY: You and/or your care partner have signed paperwork which will be entered into your electronic medical record.  These signatures attest to the fact that that the information above on your After Visit Summary has been reviewed and is understood.  Full responsibility of the  confidentiality of this discharge information lies with you and/or your care-partner.   Handouts were given to your care partner on polyps, diverticulosis, hemorrhoids, esophagitis,  hiatal hernia and the dilatation diet to follow the rest of the day. You may resume your current medications today. Use fiber, for example Citrucel, Fibercon, Konsyl or Metamucil. Await biopsy results. The office will call you to schedule GI clinic appointment. Please call if any questions or concerns.

## 2018-12-23 NOTE — Progress Notes (Signed)
Spontaneous respirations throughout. VSS. Resting comfortably. To PACU on room air. Report to  RN. 

## 2018-12-23 NOTE — Progress Notes (Signed)
Called to room to assist during endoscopic procedure.  Patient ID and intended procedure confirmed with present staff. Received instructions for my participation in the procedure from the performing physician.  

## 2018-12-23 NOTE — Progress Notes (Signed)
No problems noted in the recovery room. maw 

## 2018-12-23 NOTE — Op Note (Signed)
[facility] Patient Name: Gwendolyn Lee Procedure Date: 12/23/2018 12:52 PM MRN: [id number] Endoscopist: Gerrit Heck , MD Age: 38 Referring MD:  Date of Birth: 1980/08/03 Gender: Female Account #: [id number] Procedure:                Upper GI endoscopy Indications:              Epigastric abdominal pain, Abdominal pain in the                            left upper quadrant, Dysphagia, Diarrhea, Early                            satiety, Nausea with vomiting Medicines:                Monitored Anesthesia Care Procedure:                Pre-Anesthesia Assessment:                           - Prior to the procedure, a History and Physical                            was performed, and patient medications and                            allergies were reviewed. The patient's tolerance of                            previous anesthesia was also reviewed. The risks                            and benefits of the procedure and the sedation                            options and risks were discussed with the patient.                            All questions were answered, and informed consent                            was obtained. Prior Anticoagulants: The patient has                            taken no previous anticoagulant or antiplatelet                            agents. ASA Grade Assessment: II - A patient with                            mild systemic disease. After reviewing the risks                            and benefits, the patient was deemed in  satisfactory condition to undergo the procedure.                           After obtaining informed consent, the endoscope was                            passed under direct vision. Throughout the                            procedure, the patient's blood pressure, pulse, and                            oxygen saturations were monitored continuously. The                            Model GIF-[id number]  scope was introduced                            through the mouth, and advanced to the second part                            of duodenum. The upper GI endoscopy was                            accomplished without difficulty. The patient                            tolerated the procedure well. Scope In: Scope Out: Findings:                 One benign-appearing, intrinsic mild stenosis was                            found 35 cm from the incisors. This stenosis                            measured 1 cm (in length). The stenosis was                            traversed. The scope was withdrawn. Dilation was                            performed with a Maloney dilator with mild                            resistance at 33 Fr. The dilation site was examined                            following endoscope reinsertion and showed mild                            mucosal disruption consistent with successful                            dilation.  There was an additional mucosal rent                            noted at 17 cm from the incisors, consistent with                            successful dilation of a subtle stricture at the                            UES, consistent with reported symptoms of dysphagia                            at both the suprasternal notch and lower sternal                            border. Estimated blood loss was minimal.                           Esophagogastric landmarks were identified: the                            Z-line was found at 36 cm, the gastroesophageal                            junction was found at 36 cm and the site of hiatal                            narrowing was found at 40 cm from the incisors.                           A 4 cm hiatal hernia was present.                           LA Grade A (one or more mucosal breaks less than 5                            mm, not extending between tops of 2 mucosal folds)                            esophagitis with no  bleeding was found 36 cm from                            the incisors.                           The gastroesophageal flap valve was visualized                            endoscopically and classified as Hill Grade III                            (minimal fold, loose to endoscope, hiatal hernia  likely).                           There was subtle felinization in the mid esophagus.                            Otherwise, the upper third of the esophagus and                            middle third of the esophagus were normal. Biopsies                            were taken from the distal and proximal esophagus                            with a cold forceps for histology. Estimated blood                            loss was minimal.                           The entire examined stomach was normal. Biopsies                            were taken with a cold forceps for Helicobacter                            pylori testing. Estimated blood loss was minimal.                           The duodenal bulb, first portion of the duodenum                            and second portion of the duodenum were normal.                            Biopsies for histology were taken with a cold                            forceps for evaluation of celiac disease. Estimated                            blood loss was minimal. Complications:            No immediate complications. Estimated Blood Loss:     Estimated blood loss was minimal. Impression:               - Benign-appearing esophageal stenosis.                            Successfully dilated with a 54 Fr Maloney dilator,                            with mucosal rent noted at 17 cm and 35 cm from the  incisors.                           - Esophagogastric landmarks identified.                           - 4 cm hiatal hernia.                           - LA Grade A esophagitis.                           -  Gastroesophageal flap valve classified as Hill                            Grade III (minimal fold, loose to endoscope, hiatal                            hernia likely).                           - Normal upper third of esophagus and middle third                            of esophagus. Biopsied to evaluate for Eosinophilic                            Esophagitis.                           - Normal stomach. Biopsied.                           - Normal duodenal bulb, first portion of the                            duodenum and second portion of the duodenum.                            Biopsied. Recommendation:           - Patient has a contact number available for                            emergencies. The signs and symptoms of potential                            delayed complications were discussed with the                            patient. Return to normal activities tomorrow.                            Written discharge instructions were provided to the                            patient.                           -  Soft diet today.                           - Continue present medications.                           - Await pathology results.                           - Colonoscopy today. Gerrit Heck, MD 12/23/2018 1:33:48 PM

## 2018-12-23 NOTE — Op Note (Signed)
[facility] Patient Name: Gwendolyn Lee Procedure Date: 12/23/2018 12:51 PM MRN: [id number] Endoscopist: Gerrit Heck , MD Age: 38 Referring MD:  Date of Birth: 05/28/1981 Gender: Female Account #: [id number] Procedure:                Colonoscopy Indications:              Diarrhea, Follow-up of diverticulitis Medicines:                Monitored Anesthesia Care Procedure:                Pre-Anesthesia Assessment:                           - Prior to the procedure, a History and Physical                            was performed, and patient medications and                            allergies were reviewed. The patient's tolerance of                            previous anesthesia was also reviewed. The risks                            and benefits of the procedure and the sedation                            options and risks were discussed with the patient.                            All questions were answered, and informed consent                            was obtained. Prior Anticoagulants: The patient has                            taken no previous anticoagulant or antiplatelet                            agents. ASA Grade Assessment: II - A patient with                            mild systemic disease. After reviewing the risks                            and benefits, the patient was deemed in                            satisfactory condition to undergo the procedure.                           After obtaining informed consent, the colonoscope  was passed under direct vision. Throughout the                            procedure, the patient's blood pressure, pulse, and                            oxygen saturations were monitored continuously. The                            Colonoscope was introduced through the anus and                            advanced to the the terminal ileum. The colonoscopy                            was performed without  difficulty. The patient                            tolerated the procedure well. The quality of the                            bowel preparation was fair. The terminal ileum,                            ileocecal valve, appendiceal orifice, and rectum                            were photographed. Scope In: 1:12:35 PM Scope Out: 1:22:11 PM Scope Withdrawal Time: 0 hours 7 minutes 31 seconds  Total Procedure Duration: 0 hours 9 minutes 36 seconds  Findings:                 Hemorrhoids were found on perianal exam.                           Two diverticula were found in the descending colon.                           A 3 mm polyp was found in the sigmoid colon. The                            polyp was sessile. The polyp was removed with a                            cold biopsy forceps. Resection and retrieval were                            complete. Estimated blood loss was minimal.                           A moderate amount of semi-liquid stool was found                            scattered throughout the colon, interfering with  visualization. Lavage of the area was performed                            using copious amounts of sterile water, resulting                            in clearance with fair visualization.                           The visualized mucosa was otherwise normal                            appearing throughout the colon. Biopsies for                            histology were taken with a cold forceps from the                            right colon and left colon for evaluation of                            microscopic colitis. Estimated blood loss was                            minimal.                           Non-bleeding internal hemorrhoids were found during                            retroflexion. The hemorrhoids were small.                           The terminal ileum appeared normal. Complications:            No immediate  complications. Estimated Blood Loss:     Estimated blood loss was minimal. Impression:               - Preparation of the colon was fair.                           - Hemorrhoids found on perianal exam.                           - Diverticulosis in the descending colon.                           - One 3 mm polyp in the sigmoid colon, removed with                            a cold biopsy forceps. Resected and retrieved.                           - Stool in the entire examined colon.                           -  Normal mucosa in the entire examined colon.                            Biopsied.                           - Non-bleeding internal hemorrhoids.                           - The examined portion of the ileum was normal. Recommendation:           - Patient has a contact number available for                            emergencies. The signs and symptoms of potential                            delayed complications were discussed with the                            patient. Return to normal activities tomorrow.                            Written discharge instructions were provided to the                            patient.                           - Soft diet today.                           - Continue present medications.                           - Await pathology results.                           - Repeat colonoscopy at age 40 for screening                            purposes.                           - Return to GI clinic at appointment to be                            scheduled.                           - Use fiber, for example Citrucel, Fibercon, Konsyl                            or Metamucil. Gerrit Heck, MD 12/23/2018 1:39:39 PM

## 2018-12-23 NOTE — Telephone Encounter (Signed)
Patient called to report she vomited after second dose of her prep.  Reported that she had  fully liquid BM mostly yellow or brown.  Encouraged her to continue to drink water until her cut off time and come in for her 1 Pm coloscopy.

## 2018-12-25 ENCOUNTER — Telehealth: Payer: Self-pay

## 2018-12-25 ENCOUNTER — Other Ambulatory Visit: Payer: Self-pay

## 2018-12-25 DIAGNOSIS — R1310 Dysphagia, unspecified: Secondary | ICD-10-CM

## 2018-12-25 DIAGNOSIS — R1319 Other dysphagia: Secondary | ICD-10-CM

## 2018-12-25 MED ORDER — SUCRALFATE 1 GM/10ML PO SUSP: 1.0000 g | Freq: Four times a day (QID) | ORAL | 1 refills | Status: DC | PRN

## 2018-12-25 NOTE — Telephone Encounter (Signed)
  Follow up Call-  Call back number 12/23/2018  Post procedure Call Back phone  # [phone]  Permission to leave phone message Yes  Some recent data might be hidden     Left message on voicemail

## 2018-12-25 NOTE — Telephone Encounter (Signed)
  Follow up Call-  Call back number 12/23/2018  Post procedure Call Back phone  # [phone]  Permission to leave phone message Yes  Some recent data might be hidden     Patient questions:  Do you have a fever, pain , or abdominal swelling? Yes.   Pain Score  8 *  Have you tolerated food without any problems? Yes.    Have you been able to return to your normal activities? Yes.    Do you have any questions about your discharge instructions: Diet   No. Medications  No. Follow up visit  No.  Do you have questions or concerns about your Care? Yes.    Throat hurts "extremely bad" when swallows, patient states it is an 8 out of 10.  Actions: * If pain score is 4 or above: Physician/ provider Notified : Vito Cirigliano, DO.  1. Have you developed a fever since your procedure? No  2.   Have you had an respiratory symptoms (SOB or cough) since your procedure? No  3.   Have you tested positive for COVID 19 since your procedure No  4.   Have you had any family members/close contacts diagnosed with the COVID 19 since your procedure?  No   If yes to any of these questions please route to Joylene John, RN and Alphonsa Gin, RN.

## 2018-12-25 NOTE — Telephone Encounter (Signed)
Can you please enter order for Carafate suspension 1 gm PO prn Q6 hours. 240 mL RF1. Thanks.

## 2018-12-27 ENCOUNTER — Other Ambulatory Visit: Payer: Self-pay | Admitting: Physician Assistant

## 2018-12-27 DIAGNOSIS — F331 Major depressive disorder, recurrent, moderate: Secondary | ICD-10-CM

## 2018-12-30 ENCOUNTER — Encounter: Payer: Self-pay | Admitting: Gastroenterology

## 2019-01-01 ENCOUNTER — Other Ambulatory Visit: Payer: Self-pay | Admitting: Physician Assistant

## 2019-01-01 DIAGNOSIS — F410 Panic disorder [episodic paroxysmal anxiety] without agoraphobia: Secondary | ICD-10-CM

## 2019-01-04 ENCOUNTER — Other Ambulatory Visit: Payer: Self-pay | Admitting: Physician Assistant

## 2019-01-04 DIAGNOSIS — F419 Anxiety disorder, unspecified: Secondary | ICD-10-CM

## 2019-01-08 ENCOUNTER — Encounter: Payer: Self-pay | Admitting: Physician Assistant

## 2019-01-09 NOTE — Telephone Encounter (Signed)
Patient states Trintell[employer] no longer covered by insurance [employer], can you please contact insurance to get more info (reason it is not covered, list of alternatives)?

## 2019-01-19 NOTE — Telephone Encounter (Signed)
Have you received anything abut scheduling this pt an appointment?

## 2019-01-27 ENCOUNTER — Ambulatory Visit: Payer: BLUE CROSS/BLUE SHIELD | Admitting: Gastroenterology

## 2019-01-29 ENCOUNTER — Encounter: Payer: Self-pay | Admitting: Gastroenterology

## 2019-01-29 ENCOUNTER — Telehealth (INDEPENDENT_AMBULATORY_CARE_PROVIDER_SITE_OTHER): Payer: BC Managed Care – PPO | Admitting: Gastroenterology

## 2019-01-29 ENCOUNTER — Ambulatory Visit: Payer: BLUE CROSS/BLUE SHIELD | Admitting: Gastroenterology

## 2019-01-29 ENCOUNTER — Other Ambulatory Visit: Payer: Self-pay

## 2019-01-29 VITALS — Ht 65.0 in | Wt 115.0 lb

## 2019-01-29 DIAGNOSIS — K5790 Diverticulosis of intestine, part unspecified, without perforation or abscess without bleeding: Secondary | ICD-10-CM

## 2019-01-29 DIAGNOSIS — K2100 Gastro-esophageal reflux disease with esophagitis, without bleeding: Secondary | ICD-10-CM

## 2019-01-29 DIAGNOSIS — K449 Diaphragmatic hernia without obstruction or gangrene: Secondary | ICD-10-CM | POA: Diagnosis not present

## 2019-01-29 DIAGNOSIS — R1319 Other dysphagia: Secondary | ICD-10-CM

## 2019-01-29 DIAGNOSIS — R110 Nausea: Secondary | ICD-10-CM

## 2019-01-29 DIAGNOSIS — K7689 Other specified diseases of liver: Secondary | ICD-10-CM | POA: Diagnosis not present

## 2019-01-29 DIAGNOSIS — K580 Irritable bowel syndrome with diarrhea: Secondary | ICD-10-CM

## 2019-01-29 DIAGNOSIS — R1310 Dysphagia, unspecified: Secondary | ICD-10-CM

## 2019-01-29 DIAGNOSIS — R6881 Early satiety: Secondary | ICD-10-CM

## 2019-01-29 NOTE — Patient Instructions (Signed)
If you are age 36 or older, your body mass index should be between 23-30. Your Body mass index is 19.14 kg/m. If this is out of the aforementioned range listed, please consider follow up with your Primary Care Provider.  If you are age 58 or younger, your body mass index should be between 19-25. Your Body mass index is 19.14 kg/m. If this is out of the aformentioned range listed, please consider follow up with your Primary Care Provider.   We have sent a referral to Dr. Redmond Pulling.  It was a pleasure to see you today!  Gwendolyn Lee, D.O.

## 2019-01-29 NOTE — Progress Notes (Signed)
Chief Complaint: GERD with erosive esophagitis, hiatal hernia, preoperative evaluation  GI history: 38 y.o. female with a history of depression/anxiety  initially referred to the Gastroenterology Clinic in 09/2018 for evaluation of hepatic cysts noted on prior CT.   1) Hepatic cysts: Stable hepatic cyst on multiple imaging studies starting in 02/2017 (1.6 cm incidentally noted on CT for acute diverticulitis), then seen again on CT in 06/2018 (1.6 cm) and RUQ ultrasound in 08/2018 (1.8 cm).  Appearance consistent with simple hepatic cyst, otherwise normal hepatic parenchyma and normal liver enzymes.  2) GERD/Hiatal hernia: Long history of reflux, previously treated with on-demand Tums.  EGD in 12/2018 with 4 cm HH and LA Grade A Erosive Esophagitis.  Resolution of reflux symptoms with Nexium.  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  Treated with EGD with esophageal dilation 12/2018, with appropriate mucosal rent, along with second rent formed at 17 cm from incisors, consistent with subtle stricture at the UES.  Resolution of dysphagia with esophageal dilation.  4) Nausea/Vomiting: Resolved with PPI therapy.   5) History of diverticulitis: Had single episode of diverticulitis in 02/2017 c/b phlegmon on CT. Colonoscopy with only 2 diverticula in the descending colon.  No recurrence of diverticulitis.  6) IBS D: History of intermittent loose, nonbloody stools.  Colonoscopy otherwise unrevealing, to include biopsies negative for Microscopic Colitis.  Symptoms improved with dietary modifications.  Endoscopic history: -EGD (12/2018, Dr. Bryan Lemma): Mild stenosis in lower esophagus, dilated with 67 Isabell Jarvis with appropriate mucosal rent and second rent noted at 17 cm.  4 cm HH with LA Grade A esophagitis, Hill grade 3 valve, normal esophageal biopsies, normal stomach and duodenum with normal gastric/duodenal biopsies -Colonoscopy  (12/2018, Dr. Bryan Lemma): 2 diverticula in the descending colon, 3 mm sigmoid TA, fair prep with otherwise normal-appearing mucosa, biopsies negative for [facility], internal hemorrhoids.  Normal TI.  Repeat in 5-7 years   HPI:    Due to current restrictions/limitations of in-office visits due to the COVID-19 pandemic, this scheduled clinical appointment was converted to a telehealth virtual consultation using [employer].  -Time of medical discussion: 30 minutes  -The patient did consent to this virtual visit and is aware of possible charges through their insurance for this visit.  -Names of all parties present: Gwendolyn Lee (patient), Gerrit Heck, DO, [facility] (physician) -Patient location: Home -Physician location: Office  Gwendolyn Lee is a 38 y.o. female referred to the Gastroenterology Clinic for routine follow-up and to discuss hiatal hernia repair/TIF.  Initially seen by me on 09/25/2018 as above. She states she has had improvement/near resolution in dysphagia since EGD with dilation. Still with early satiety and post prandial upper abdominal fullness. Has had improvement since restarting Nexium 40 mg/day. In retrospect, she states she has had reflux sxs intermittently for years. Had been taking Tums regularly.  Index symptoms of heartburn and regurgitation, which has essentially resolved since starting Nexium.  She is very interested in laparoscopic hiatal hernia repair/TIF as a means to stop or significantly reduce acid suppression therapy.  Stopped Carafate. Nausea/vomiting has completely resolved.  Diarrhea much improved.  Past medical history, past surgical history, social history, family history, medications, and allergies reviewed in the chart and with patient.    Past Medical History:  Diagnosis Date  . Anxiety   . Depression   . Diverticulitis   . Diverticulosis   . GERD (gastroesophageal reflux disease)   .  Liver cyst 09/16/2018   1.8 x 1.8 cm cyst in right lobe of liver     Past  Surgical History:  Procedure Laterality Date  . CARDIAC SURGERY    . VALVE REPLACEMENT     Family History  Problem Relation Age of Onset  . Healthy Mother   . Healthy Father   . Stomach cancer Maternal Grandmother   . Esophageal cancer Maternal Grandmother   . Colon cancer Neg Hx    Social History   Tobacco Use  . Smoking status: Current Every Day Smoker    Packs/day: 0.30    Types: Cigarettes  . Smokeless tobacco: Never Used  Substance Use Topics  . Alcohol use: Yes    Alcohol/week: 20.0 standard drinks    Types: 20 Standard drinks or equivalent per week  . Drug use: Yes    Types: Marijuana    Comment: daily   Current Outpatient Medications  Medication Sig Dispense Refill  . busPIRone (BUSPAR) 15 MG tablet Take 1 tablet (15 mg total) by mouth 3 (three) times daily. APPT FOR REFILLS 270 tablet 0  . clonazePAM (KLONOPIN) 0.5 MG tablet Take 1 tablet (0.5 mg total) by mouth once as needed for up to 1 dose for anxiety (panic). 10 tablet 0  . esomeprazole (NEXIUM) 40 MG capsule Take 40 mg by mouth daily at 12 noon.    . sucralfate (CARAFATE) 1 GM/10ML suspension Take 10 mLs (1 g total) by mouth 4 (four) times daily as needed. 240 mL 1  . traZODone (DESYREL) 100 MG tablet TAKE 1 TABLET (100 MG TOTAL) BY MOUTH AT BEDTIME AS NEEDED FOR SLEEP. 90 tablet 1  . TRINTELLIX 10 MG TABS tablet TAKE 1 TABLET BY MOUTH EVERY DAY 90 tablet 0   No current facility-administered medications for this visit.    Allergies  Allergen Reactions  . Ceclor [Cefaclor] Rash     Review of Systems: All systems reviewed and negative except where noted in HPI.     Physical Exam:    Complete physical exam not completed due to the nature of this telehealth communication.   Gen: Awake, alert, and oriented, and well communicative. HEENT: EOMI, non-icteric sclera, NCAT, MMM Neck: Normal movement of head and neck Pulm: No labored breathing, speaking in full sentences without conversational dyspnea  Derm: No apparent lesions or bruising in visible field MS: Moves all visible extremities without noticeable abnormality Psych: Pleasant, cooperative, normal speech, thought processing seemingly intact   ASSESSMENT AND PLAN;   1) GERD with Erosive Esophagitis 2) Hiatal hernia Long-standing history of GERD and recently diagnosed LA Grade A erosive esophagitis and 4 cm hiatal hernia on EGD.  Symptoms responsive to PPI therapy but requesting antireflux surgery with goal of improved/resolved symptoms, resolution of esophagitis, and stopping acid suppression medications. Discussed the pathophysiology of GERD at length, to include the risks of untreated reflux (ie, strictures, Barrett's Esophagus, EAC, etc) as well as the possible treatment with medications vs antireflux surgery. In particular, we discussed the risks, benefits, and alternatives of Transoral Incisionless Fundoplication (TIF), to include Nissen fundoplication, and the patient wishes to proceed with concomitant laparoscopic hiatal hernia repair/TIF.   -Given clear objective evidence of reflux (erosive esophagitis) on EGD and good response to PPI therapy, no further diagnostic evaluation needed at this time -Referral to Dr. Redmond Pulling at [facility] for consideration of concomitant HH repair/TIF - Confirmed allergies with the patient and no allergies to planned perioperative antibiotics - Rreviewed postoperative dietary and activity restrictions with patient  and provided handout - Anticipated 23 hour stay  - Additional recommendations regarding medications and post-operative diet to follow [place] repair/TIF completion  - All questions answered   3) Early satiety 4) Abdominal fullness Suspect these upper GI symptoms stem from her large hiatal hernia (vs IBS), as she reports gas type fullness in the subxiphoid area.  Clinical symptoms do not seem consistent with impaired gastric emptying.  Discussed that there is a good possibility for relief of symptoms  with repair of the hiatal hernia, but not an absolute.  She understands this and agrees.  5) Nausea: -Resolved  6) Diverticulosis -Diverticulitis in 02/2017 c/b phlegmon, without recurrence.  7) Dysphagia: -Resolved with recent EGD with dilation.  Will consider placement of bougie dilator prior to TIF  8) IBS D: -Improved with dietary mods -Low FODMAP diet  9) Hepatic cyst: Stable hepatic cyst on multiple imaging studies starting in 02/2017 (1.6 cm on CT), then seen again on CT in 06/2018 (1.6 cm) and RUQ ultrasound in 08/2018 (1.8 cm).  Appearance consistent with simple hepatic cyst, otherwise normal hepatic parenchyma and normal liver enzymes.  -Given the stability over time, no additional imaging or evaluation needed at this time.   Lavena Bullion, DO, FACG  01/29/2019, 10:57 AM   Maisie Fus, Gretta Arab*

## 2019-02-10 NOTE — Telephone Encounter (Signed)
So, what exactly do I need to do or does this need to be set up by Nira Conn so that a pre cert is completed?

## 2019-02-12 ENCOUNTER — Other Ambulatory Visit: Payer: Self-pay | Admitting: Physician Assistant

## 2019-02-12 DIAGNOSIS — R110 Nausea: Secondary | ICD-10-CM

## 2019-02-13 ENCOUNTER — Other Ambulatory Visit: Payer: Self-pay | Admitting: Physician Assistant

## 2019-02-13 DIAGNOSIS — R110 Nausea: Secondary | ICD-10-CM

## 2019-03-02 ENCOUNTER — Other Ambulatory Visit: Payer: Self-pay | Admitting: General Surgery

## 2019-03-02 DIAGNOSIS — K219 Gastro-esophageal reflux disease without esophagitis: Secondary | ICD-10-CM

## 2019-03-02 DIAGNOSIS — K449 Diaphragmatic hernia without obstruction or gangrene: Secondary | ICD-10-CM

## 2019-03-05 ENCOUNTER — Ambulatory Visit
Admission: RE | Admit: 2019-03-05 | Discharge: 2019-03-05 | Disposition: A | Discharge status: Home / Self Care | Payer: BC Managed Care – PPO | Source: Ambulatory Visit | Attending: General Surgery | Admitting: General Surgery

## 2019-03-05 DIAGNOSIS — K219 Gastro-esophageal reflux disease without esophagitis: Secondary | ICD-10-CM

## 2019-03-05 DIAGNOSIS — K449 Diaphragmatic hernia without obstruction or gangrene: Secondary | ICD-10-CM

## 2019-03-17 ENCOUNTER — Telehealth: Payer: Self-pay | Admitting: Gastroenterology

## 2019-03-17 NOTE — Telephone Encounter (Signed)
Spoke with patient at length today regardng recent UGI series which demonstrated moderate reflux to upper thoracic esophagus, small sliding HH, and o/w normal motility.  She has since completely stopped smoking.  Reflux symptoms otherwise generally well controlled.  Has had increased cough lately, but this is consistent with prior attempts at smoking cessation with initial exacerbation of cough.  She states that she would like to hold off on any surgical intervention at this time, and would like to instead see if she can have clinical improvement now that she has stopped smoking.  Cough seems to be her worst symptom.  Did have significant reflux noted on UGI series to the thoracic esophagus.  Did discuss that Dr. Redmond Pulling and I talked previously about potentially doing a pH/impedance study prior to any surgical intervention to establish to what degree her symptoms are related to reflux.  She agrees that that could be beneficial, but again thinks that she might hold off on surgery at this juncture.  I think this is a very reasonable approach.  Should she want to explore antireflux surgery and HH repair in the future, we will plan to revisit symptomatology at that time to determine whether or not a pH/impedance study would be indicated at all.  All questions answered and she was grateful for the phone conversation.  Can follow-up with me in the GI clinic anytime.  Otherwise, to resume acid suppression therapy as prescribed and antireflux lifestyle measures.

## 2019-04-10 ENCOUNTER — Other Ambulatory Visit: Payer: Self-pay | Admitting: Physician Assistant

## 2019-04-10 DIAGNOSIS — F331 Major depressive disorder, recurrent, moderate: Secondary | ICD-10-CM

## 2019-05-01 ENCOUNTER — Other Ambulatory Visit: Payer: Self-pay | Admitting: Physician Assistant

## 2019-05-01 DIAGNOSIS — R110 Nausea: Secondary | ICD-10-CM

## 2019-05-02 ENCOUNTER — Other Ambulatory Visit: Payer: Self-pay | Admitting: Physician Assistant

## 2019-05-02 DIAGNOSIS — F410 Panic disorder [episodic paroxysmal anxiety] without agoraphobia: Secondary | ICD-10-CM

## 2019-06-04 ENCOUNTER — Other Ambulatory Visit: Payer: Self-pay | Admitting: Osteopathic Medicine

## 2019-06-04 DIAGNOSIS — F410 Panic disorder [episodic paroxysmal anxiety] without agoraphobia: Secondary | ICD-10-CM

## 2019-06-04 NOTE — Telephone Encounter (Signed)
Requested medication (s) are due for refill today: yes  Requested medication (s) are on the active medication list: yes  Last refill:  05/07/19  Future visit scheduled: no  Notes to clinic:  Requesting 90 day supply with refills   Requested Prescriptions  Pending Prescriptions Disp Refills   busPIRone (BUSPAR) 15 MG tablet [Pharmacy Med Name: BUSPIRONE HCL 15 MG TABLET] 270 tablet 1    Sig: TAKE 1 TABLET (15 MG TOTAL) BY MOUTH 3 (THREE) TIMES DAILY. APPT FOR REFILLS     Psychiatry: Anxiolytics/Hypnotics - Non-controlled Failed - 06/04/2019  2:30 PM      Failed - Valid encounter within last 6 months    Recent Outpatient Visits          6 months ago Chronic nausea   [place], Elson Areas, PA-C   8 months ago Anxiety disorder, unspecified type   [place], Elson Areas, PA-C   9 months ago Encounter to establish care   [place], Elson Areas, PA-C   7 years ago Cervical pain   [facility], Fenton Malling, MD   7 years ago Acute neck pain   [facility] at [facility], Fenton Malling, MD

## 2019-06-05 ENCOUNTER — Other Ambulatory Visit: Payer: Self-pay | Admitting: Physician Assistant

## 2019-06-10 ENCOUNTER — Ambulatory Visit (INDEPENDENT_AMBULATORY_CARE_PROVIDER_SITE_OTHER): Payer: BC Managed Care – PPO | Admitting: Physician Assistant

## 2019-06-10 ENCOUNTER — Other Ambulatory Visit: Payer: Self-pay

## 2019-06-10 VITALS — BP 128/70 | HR 72 | Temp 98.0°F | Wt 128.0 lb

## 2019-06-10 DIAGNOSIS — R824 Acetonuria: Secondary | ICD-10-CM | POA: Diagnosis not present

## 2019-06-10 DIAGNOSIS — R300 Dysuria: Secondary | ICD-10-CM | POA: Diagnosis not present

## 2019-06-10 DIAGNOSIS — Z113 Encounter for screening for infections with a predominantly sexual mode of transmission: Secondary | ICD-10-CM

## 2019-06-10 LAB — POCT URINALYSIS DIP (CLINITEK)
Bilirubin, UA: NEGATIVE
Blood, UA: NEGATIVE
Glucose, UA: NEGATIVE mg/dL
Leukocytes, UA: NEGATIVE
Nitrite, UA: NEGATIVE
POC PROTEIN,UA: NEGATIVE
Spec Grav, UA: >=1.03 — AB (ref 1.010–1.025)
Urobilinogen, UA: 1 E.U./dL
pH, UA: 7 (ref 5.0–8.0)

## 2019-06-10 NOTE — Progress Notes (Signed)
Pt in office today for ? UTI , pt also requested STI check. Pt states she has been having burning with urination. Pt did have intercourse last PM with new partner. Urine collected and sent to lab.

## 2019-06-11 ENCOUNTER — Encounter: Payer: Self-pay | Admitting: Physician Assistant

## 2019-06-11 DIAGNOSIS — R824 Acetonuria: Secondary | ICD-10-CM | POA: Insufficient documentation

## 2019-06-11 NOTE — Progress Notes (Signed)
Patient ID: Gwendolyn Lee, female   DOB: 1981/03/27, 38 y.o.   MRN: [id number] .Marland Kitchen Results for orders placed or performed in visit on 06/10/19  POCT URINALYSIS DIP (CLINITEK)  Result Value Ref Range   Color, UA yellow yellow   Clarity, UA clear clear   Glucose, UA negative negative mg/dL   Bilirubin, UA negative negative   Ketones, POC UA >= (160) (A) negative mg/dL   Spec Grav, UA >=1.030 (A) 1.010 - 1.025   Blood, UA negative negative   pH, UA 7.0 5.0 - 8.0   POC PROTEIN,UA negative negative, trace   Urobilinogen, UA 1.0 0.2 or 1.0 E.U./dL   Nitrite, UA Negative Negative   Leukocytes, UA Negative Negative    Positive for ketones. Will culture urine. STD testing ordered.

## 2019-06-12 LAB — URINE CULTURE
MICRO NUMBER:: 1115238
Result:: NO GROWTH
SPECIMEN QUALITY:: ADEQUATE

## 2019-06-12 NOTE — Progress Notes (Signed)
Urine culture is negative. No infection. Your original urine did have some ketones in it. Could be from strenuous exercise, low carb diet do either of those apply to you?

## 2019-06-15 ENCOUNTER — Encounter: Payer: Self-pay | Admitting: Physician Assistant

## 2019-06-15 ENCOUNTER — Other Ambulatory Visit: Payer: Self-pay

## 2019-06-15 ENCOUNTER — Ambulatory Visit (INDEPENDENT_AMBULATORY_CARE_PROVIDER_SITE_OTHER): Payer: BC Managed Care – PPO | Admitting: Physician Assistant

## 2019-06-15 VITALS — BP 108/77 | HR 62 | Ht 64.0 in | Wt 126.0 lb

## 2019-06-15 DIAGNOSIS — F331 Major depressive disorder, recurrent, moderate: Secondary | ICD-10-CM

## 2019-06-15 DIAGNOSIS — F410 Panic disorder [episodic paroxysmal anxiety] without agoraphobia: Secondary | ICD-10-CM

## 2019-06-15 DIAGNOSIS — F39 Unspecified mood [affective] disorder: Secondary | ICD-10-CM

## 2019-06-15 DIAGNOSIS — F5104 Psychophysiologic insomnia: Secondary | ICD-10-CM | POA: Diagnosis not present

## 2019-06-15 LAB — GC/CHLAMYDIA PROBE AMP
Chlamydia trachomatis, NAA: NEGATIVE
Neisseria Gonorrhoeae by PCR: NEGATIVE

## 2019-06-15 LAB — SPECIMEN STATUS REPORT

## 2019-06-15 MED ORDER — LAMOTRIGINE 25 MG PO TABS: ORAL_TABLET | ORAL | 1 refills | Status: DC

## 2019-06-15 MED ORDER — MIRTAZAPINE 15 MG PO TABS: 15.0000 mg | ORAL_TABLET | Freq: Every day | ORAL | 1 refills | Status: DC

## 2019-06-15 MED ORDER — CLONAZEPAM 0.5 MG PO TABS: 0.5000 mg | ORAL_TABLET | Freq: Once | ORAL | 0 refills | Status: DC | PRN

## 2019-06-15 NOTE — Progress Notes (Signed)
No growth on culture

## 2019-06-15 NOTE — Progress Notes (Signed)
Subjective:    Patient ID: Gwendolyn Lee, female    DOB: August 22, 1980, 38 y.o.   MRN: [id number]  HPI  Patient is a 38 year old female who has been diagnosed with anxiety and depression and insomnia for many years.  She comes in today not happy with her medication regimen.  She was previously on Trintellix which she thought was helping with her mood some but after stopping it she realized it was causing a lot of her chronic nausea.  She has been working with GI to help get her nausea under control.  She feels the best she has felt in a long time with her GI symptoms.  Her mood however is very out of control.  She is not sleeping, she is very irritable, she is very anxious and depressed.  She has tried Zoloft in the past and it just seemed to not work.  BuSpar does not feel like it is working at all and 3 times a day is really hard to remember for her.  She admits she has a lot of life events going on right now.  She is in the middle of divorce.  She continues to use Klonopin for her panic attacks and acute anxiety.  She tries not to take daily.  She has stopped smoking since August.  She does feel like this has exacerbated her anxiety.  .. Active Ambulatory Problems    Diagnosis Date Noted  . Chronic insomnia 08/11/2018  . Moderate episode of recurrent major depressive disorder ([place]) 08/11/2018  . Panic disorder 08/11/2018  . Marijuana use 08/11/2018  . Chronic nausea 09/08/2018  . Diarrhea 09/08/2018  . Transaminitis 09/08/2018  . Thrombocytopenia ([place]) 09/08/2018  . Diverticulosis 09/08/2018  . Allergic rhinitis 08/29/2015  . Chronic neck pain 08/29/2015  . Herniated cervical disc 08/29/2015  . Hyperlipidemia 08/29/2015  . Liver cyst 09/16/2018  . Globus sensation 11/13/2018  . Heavy alcohol use 11/13/2018  . Ketonuria 06/11/2019  . Mood disorder ([place]) 06/16/2019   Resolved Ambulatory Problems    Diagnosis Date Noted  . No Resolved Ambulatory Problems   Past Medical History:   Diagnosis Date  . Anxiety   . Depression   . Diverticulitis   . GERD (gastroesophageal reflux disease)     Review of Systems See HPI.     Objective:   Physical Exam Vitals signs reviewed.  Constitutional:      Appearance: Normal appearance.  Cardiovascular:     Rate and Rhythm: Normal rate and regular rhythm.     Pulses: Normal pulses.     Heart sounds: Normal heart sounds.  Neurological:     General: No focal deficit present.     Mental Status: She is alert and oriented to person, place, and time.  Psychiatric:     Comments: anxious       .Marland Kitchen Depression screen [facility] 2/9 06/15/2019 09/08/2018 08/11/2018  Decreased Interest 2 2 2   Down, Depressed, Hopeless 2 3 2   PHQ - 2 Score 4 5 4   Altered sleeping 3 3 3   Tired, decreased energy 3 3 3   Change in appetite 3 3 1   Feeling bad or failure about yourself  1 1 2   Trouble concentrating 0 0 0  Moving slowly or fidgety/restless 0 1 0  Suicidal thoughts 0 0 0  PHQ-9 Score 14 16 13   Difficult doing work/chores Somewhat difficult - -   .Marland Kitchen GAD 7 : Generalized Anxiety Score 06/15/2019 09/08/2018 08/11/2018  Nervous, Anxious, on Edge 3 3  3  Control/stop worrying 3 3 2   Worry too much - different things 3 3 2   Trouble relaxing 1 3 2   Restless 1 1 0  Easily annoyed or irritable 2 2 3   Afraid - awful might happen 0 1 0  Total GAD 7 Score 13 16 12   Anxiety Difficulty Somewhat difficult - Very difficult   MDQ questionnaire 9/13.      Assessment & Plan:  Marland KitchenMarland KitchenDiagnoses and all orders for this visit:  Mood disorder ([place]) -     lamoTRIgine (LAMICTAL) 25 MG tablet; Take one tablet for 2 weeks then increase to 50mg  daily. -     Ambulatory referral to Psychiatry  Chronic insomnia -     mirtazapine (REMERON) 15 MG tablet; Take 1 tablet (15 mg total) by mouth at bedtime. -     Ambulatory referral to Psychiatry  Moderate episode of recurrent major depressive disorder (HCC) -     lamoTRIgine (LAMICTAL) 25 MG tablet; Take one tablet  for 2 weeks then increase to 50mg  daily. -     Ambulatory referral to Psychiatry  Panic disorder -     clonazePAM (KLONOPIN) 0.5 MG tablet; Take 1 tablet (0.5 mg total) by mouth once as needed for up to 1 dose for anxiety (panic). -     Ambulatory referral to Psychiatry   Patient's PHQ-9, GAD-7, and MDQ are all very positive.  I suspect there is some underlying mood disorder.  I would like for her to see behavioral health to make a more clear diagnosis.  I did add Lamictal and she may increase it to 50 mg daily.  Continue to use Klonopin only as needed.  Discussed dependency and abuse potential.  For her insomnia added Remeron. Strongly encouraged counseling.  Follow-up as needed  .Marland KitchenSpent 30 minutes with patient and greater than 50 percent of visit spent counseling patient regarding treatment plan.

## 2019-06-15 NOTE — Patient Instructions (Signed)
Will make referral. 

## 2019-06-16 ENCOUNTER — Encounter: Payer: Self-pay | Admitting: Physician Assistant

## 2019-06-16 DIAGNOSIS — F39 Unspecified mood [affective] disorder: Secondary | ICD-10-CM | POA: Insufficient documentation

## 2019-07-07 ENCOUNTER — Other Ambulatory Visit: Payer: Self-pay | Admitting: Physician Assistant

## 2019-07-07 DIAGNOSIS — F331 Major depressive disorder, recurrent, moderate: Secondary | ICD-10-CM

## 2019-07-07 DIAGNOSIS — F39 Unspecified mood [affective] disorder: Secondary | ICD-10-CM

## 2019-07-07 DIAGNOSIS — F5104 Psychophysiologic insomnia: Secondary | ICD-10-CM

## 2019-07-21 ENCOUNTER — Encounter: Payer: Self-pay | Admitting: Physician Assistant

## 2019-07-22 MED ORDER — QUETIAPINE FUMARATE 25 MG PO TABS: ORAL_TABLET | ORAL | 0 refills | Status: DC

## 2019-07-22 MED ORDER — LAMOTRIGINE 100 MG PO TABS: 100.0000 mg | ORAL_TABLET | Freq: Every day | ORAL | 1 refills | Status: DC

## 2019-08-06 ENCOUNTER — Other Ambulatory Visit: Payer: Self-pay | Admitting: Physician Assistant

## 2019-08-06 DIAGNOSIS — F331 Major depressive disorder, recurrent, moderate: Secondary | ICD-10-CM

## 2019-08-06 DIAGNOSIS — F39 Unspecified mood [affective] disorder: Secondary | ICD-10-CM

## 2019-08-06 DIAGNOSIS — F5104 Psychophysiologic insomnia: Secondary | ICD-10-CM

## 2019-08-10 ENCOUNTER — Other Ambulatory Visit: Payer: Self-pay | Admitting: Physician Assistant

## 2019-08-10 DIAGNOSIS — F5104 Psychophysiologic insomnia: Secondary | ICD-10-CM

## 2019-08-14 ENCOUNTER — Other Ambulatory Visit: Payer: Self-pay | Admitting: Physician Assistant

## 2019-08-22 ENCOUNTER — Other Ambulatory Visit: Payer: Self-pay | Admitting: Sports Medicine

## 2019-08-22 DIAGNOSIS — R110 Nausea: Secondary | ICD-10-CM

## 2019-08-29 ENCOUNTER — Other Ambulatory Visit: Payer: Self-pay | Admitting: Physician Assistant

## 2019-08-29 DIAGNOSIS — F410 Panic disorder [episodic paroxysmal anxiety] without agoraphobia: Secondary | ICD-10-CM

## 2019-09-08 ENCOUNTER — Other Ambulatory Visit: Payer: Self-pay | Admitting: Physician Assistant

## 2019-09-10 ENCOUNTER — Other Ambulatory Visit: Payer: Self-pay | Admitting: Osteopathic Medicine

## 2019-09-10 DIAGNOSIS — F410 Panic disorder [episodic paroxysmal anxiety] without agoraphobia: Secondary | ICD-10-CM

## 2019-09-10 NOTE — Telephone Encounter (Signed)
Refilled 90 days [facility] [employer], applications pt  Needs to est care

## 2019-09-10 NOTE — Telephone Encounter (Signed)
Left a detailed vm msg for pt regarding med refill and provider's note. Aware to contact the clinic for an appt to transition care to new provider. Direct call back info provided.

## 2019-09-10 NOTE — Telephone Encounter (Signed)
Notes to clinic:  Last telephone encounter discussed different dose.     Requested Prescriptions  Pending Prescriptions Disp Refills   busPIRone (BUSPAR) 15 MG tablet [Pharmacy Med Name: BUSPIRONE HCL 15 MG TABLET] 270 tablet 0    Sig: TAKE 1 TABLET BY MOUTH THREE TIMES A DAY. **APPOINTMENT NEEDED FOR REFILLS**      Psychiatry: Anxiolytics/Hypnotics - Non-controlled Passed - 09/10/2019 12:37 AM      Passed - Valid encounter within last 6 months    Recent Outpatient Visits           2 months ago Mood disorder [facility])   [place] [facility], Royetta Car, PA-C   3 months ago [place] [facility], Royetta Car, PA-C   10 months ago Chronic nausea   [facility], Elson Areas, PA-C   1 year ago Anxiety disorder, unspecified type   [facility], Elson Areas, PA-C   1 year ago Encounter to establish care   [place], [place], [place]

## 2019-09-18 NOTE — Telephone Encounter (Signed)
LVM for patient to call and est care

## 2019-09-22 ENCOUNTER — Other Ambulatory Visit: Payer: Self-pay | Admitting: Physician Assistant

## 2019-11-24 ENCOUNTER — Other Ambulatory Visit: Payer: Self-pay | Admitting: Sports Medicine

## 2019-11-24 DIAGNOSIS — R110 Nausea: Secondary | ICD-10-CM

## 2020-01-14 ENCOUNTER — Telehealth: Payer: Self-pay | Admitting: Neurology

## 2020-01-14 DIAGNOSIS — Z79899 Other long term (current) drug therapy: Secondary | ICD-10-CM

## 2020-01-14 NOTE — Telephone Encounter (Signed)
Received note from insurance that patient needs screening labs since on antipsychotics. Labs ordered. Mychart message sent to patient.

## 2020-02-14 ENCOUNTER — Other Ambulatory Visit: Payer: Self-pay | Admitting: Physician Assistant

## 2020-02-16 ENCOUNTER — Other Ambulatory Visit: Payer: Self-pay | Admitting: Physician Assistant

## 2020-07-23 ENCOUNTER — Other Ambulatory Visit: Payer: Self-pay | Admitting: Physician Assistant

## 2020-07-29 ENCOUNTER — Other Ambulatory Visit: Payer: Self-pay | Admitting: Physician Assistant

## 2020-08-25 ENCOUNTER — Other Ambulatory Visit: Payer: Self-pay | Admitting: Physician Assistant

## 2021-07-24 ENCOUNTER — Other Ambulatory Visit: Payer: Self-pay

## 2021-07-24 ENCOUNTER — Encounter (HOSPITAL_BASED_OUTPATIENT_CLINIC_OR_DEPARTMENT_OTHER): Payer: Self-pay

## 2021-07-24 ENCOUNTER — Emergency Department (HOSPITAL_BASED_OUTPATIENT_CLINIC_OR_DEPARTMENT_OTHER)
Admission: EM | Admit: 2021-07-24 | Discharge: 2021-07-24 | Disposition: A | Discharge status: Home / Self Care | Payer: BC Managed Care – PPO | Attending: Emergency Medicine | Admitting: Emergency Medicine

## 2021-07-24 DIAGNOSIS — A084 Viral intestinal infection, unspecified: Secondary | ICD-10-CM | POA: Diagnosis not present

## 2021-07-24 DIAGNOSIS — Z20822 Contact with and (suspected) exposure to covid-19: Secondary | ICD-10-CM | POA: Diagnosis not present

## 2021-07-24 DIAGNOSIS — R197 Diarrhea, unspecified: Secondary | ICD-10-CM | POA: Diagnosis present

## 2021-07-24 HISTORY — DX: COVID-19: U07.1

## 2021-07-24 LAB — CBC WITH DIFFERENTIAL/PLATELET
Abs Immature Granulocytes: 0.03 10*3/uL (ref 0.00–0.07)
Basophils Absolute: 0 10*3/uL (ref 0.0–0.1)
Basophils Relative: 0 %
Eosinophils Absolute: 0.2 10*3/uL (ref 0.0–0.5)
Eosinophils Relative: 3 %
HCT: 42.3 % (ref 36.0–46.0)
Hemoglobin: 14.3 g/dL (ref 12.0–15.0)
Immature Granulocytes: 1 %
Lymphocytes Relative: 5 %
Lymphs Abs: 0.3 10*3/uL — ABNORMAL LOW (ref 0.7–4.0)
MCH: 29.2 pg (ref 26.0–34.0)
MCHC: 33.8 g/dL (ref 30.0–36.0)
MCV: 86.5 fL (ref 80.0–100.0)
Monocytes Absolute: 0.3 10*3/uL (ref 0.1–1.0)
Monocytes Relative: 6 %
Neutro Abs: 5 10*3/uL (ref 1.7–7.7)
Neutrophils Relative %: 85 %
Platelets: 123 10*3/uL — ABNORMAL LOW (ref 150–400)
RBC: 4.89 MIL/uL (ref 3.87–5.11)
RDW: 13.2 % (ref 11.5–15.5)
WBC: 5.8 10*3/uL (ref 4.0–10.5)
nRBC: 0 % (ref 0.0–0.2)

## 2021-07-24 LAB — URINALYSIS, ROUTINE W REFLEX MICROSCOPIC
Glucose, UA: NEGATIVE mg/dL
Hgb urine dipstick: NEGATIVE
Ketones, ur: >=80 mg/dL — AB
Leukocytes,Ua: NEGATIVE
Nitrite: NEGATIVE
Protein, ur: NEGATIVE mg/dL
Specific Gravity, Urine: 1.025 (ref 1.005–1.030)
pH: 6 (ref 5.0–8.0)

## 2021-07-24 LAB — BASIC METABOLIC PANEL
Anion gap: 11 (ref 5–15)
BUN: 12 mg/dL (ref 6–20)
CO2: 19 mmol/L — ABNORMAL LOW (ref 22–32)
Calcium: 9.4 mg/dL (ref 8.9–10.3)
Chloride: 104 mmol/L (ref 98–111)
Creatinine, Ser: 0.82 mg/dL (ref 0.44–1.00)
GFR, Estimated: >60 mL/min (ref >60)
Glucose, Bld: 127 mg/dL — ABNORMAL HIGH (ref 70–99)
Potassium: 3.8 mmol/L (ref 3.5–5.1)
Sodium: 134 mmol/L — ABNORMAL LOW (ref 135–145)

## 2021-07-24 LAB — PREGNANCY, URINE: Preg Test, Ur: NEGATIVE

## 2021-07-24 LAB — RESP PANEL BY RT-PCR (FLU A&B, COVID) ARPGX2
Influenza A by PCR: NEGATIVE
Influenza B by PCR: NEGATIVE
SARS Coronavirus 2 by RT PCR: NEGATIVE

## 2021-07-24 MED ORDER — SODIUM CHLORIDE 0.9 % IV SOLN
12.5000 mg | Freq: Once | INTRAVENOUS | Status: DC
  Filled 2021-07-24: qty 0.5

## 2021-07-24 MED ORDER — IBUPROFEN 800 MG PO TABS
800.0000 mg | ORAL_TABLET | Freq: Once | ORAL | Status: AC
Start: 2021-07-24 — End: 2021-07-24
  Administered 2021-07-24: 800 mg via ORAL
  Filled 2021-07-24: qty 1

## 2021-07-24 MED ORDER — ONDANSETRON HCL 4 MG/2ML IJ SOLN: 4.0000 mg | Freq: Once | INTRAMUSCULAR | Status: DC

## 2021-07-24 MED ORDER — PROMETHAZINE HCL 25 MG/ML IJ SOLN
INTRAMUSCULAR | Status: AC
  Administered 2021-07-24: 12.5 mg
  Filled 2021-07-24: qty 1

## 2021-07-24 MED ORDER — SODIUM CHLORIDE 0.9 % IV BOLUS
1000.0000 mL | Freq: Once | INTRAVENOUS | Status: AC
  Administered 2021-07-24: 1000 mL via INTRAVENOUS

## 2021-07-24 MED ORDER — ONDANSETRON HCL 4 MG/2ML IJ SOLN
4.0000 mg | Freq: Once | INTRAMUSCULAR | Status: AC
  Administered 2021-07-24: 4 mg via INTRAVENOUS
  Filled 2021-07-24: qty 2

## 2021-07-24 NOTE — Discharge Instructions (Addendum)
You were seen in the emergency department today for vomiting.  As we discussed your lab work is all looked good.  I have a suspicion that your symptoms are likely related to a viral gastrointestinal infection.  These are very common after traveling and eating a different diet.  I recommend that you continue to hydrate really well, and take your Zofran as needed.  I also recommend an over-the-counter antidiarrheal, like Lomotil.  Continue to monitor how you're doing and return to the ER for new or worsening symptoms such as fevers, blood in your vomit or diarrhea, other signs of dehydration.   It has been a pleasure seeing and caring for you today and I hope you start feeling better soon!

## 2021-07-24 NOTE — ED Provider Notes (Signed)
[facility] EMERGENCY DEPARTMENT Provider Note   CSN: [id number] Arrival date & time: 07/24/21  1649     History  Chief Complaint  Patient presents with   Emesis    Gwendolyn Lee is a 41 y.o. female with history of anxiety, diverticulitis, GERD, who presents to the emergency department for vomiting and diarrhea for 2 days.  Patient states that she recently got back from a trip to the beach, when she had abrupt onset of profuse vomiting and diarrhea, and decreased p.o. intake.  She states that her father had similar symptoms, but is doing well.  She has had a headache as well as body ache, that she attributes to dehydration.  No recent antibiotic use.  Emesis Associated symptoms: abdominal pain, diarrhea, headaches and myalgias   Associated symptoms: no chills, no cough and no fever       Home Medications Prior to Admission medications   Medication Sig Start Date End Date Taking? Authorizing Provider  clonazePAM (KLONOPIN) 0.5 MG tablet TAKE 1 TABLET (0.5 MG TOTAL) BY MOUTH ONCE AS NEEDED FOR UP TO 1 DOSE FOR ANXIETY ([facility]). 08/31/19   Hali Marry, MD  esomeprazole (NEXIUM) 40 MG capsule TAKE 1 CAPSULE BY MOUTH EVERY DAY BEFORE BREAKFAST 08/23/19   Silverio Decamp, MD  lamoTRIgine (LAMICTAL) 100 MG tablet TAKE 1 TABLET BY MOUTH EVERY DAY 08/14/19   Breeback, Jade L, PA-C  QUEtiapine (SEROQUEL) 25 MG tablet TAKE 1 TO 2 TABLETS BY MOUTH AT BEDTIME AS NEEDED FOR INSOMNIA/ needs appt/labs 02/15/20   Donella Stade, PA-C      Allergies    Ceclor [cefaclor]    Review of Systems   Review of Systems  Constitutional:  Negative for chills and fever.  Respiratory:  Negative for cough and shortness of breath.   Cardiovascular:  Negative for chest pain.  Gastrointestinal:  Positive for abdominal pain, diarrhea, nausea and vomiting. Negative for blood in stool and constipation.  Genitourinary:  Negative for frequency and urgency.  Musculoskeletal:  Positive for  myalgias.  Neurological:  Positive for headaches.  All other systems reviewed and are negative.  Physical Exam Updated Vital Signs BP 120/68 (BP Location: Right Arm)    Pulse 98    Temp 99.1 F (37.3 C) (Oral)    Resp 17    Ht 5\' 3"  (1.6 m)    Wt 72.6 kg    SpO2 98%    BMI 28.34 kg/m  Physical Exam Vitals and nursing note reviewed.  Constitutional:      Appearance: Normal appearance.  HENT:     Head: Normocephalic and atraumatic.  Eyes:     Conjunctiva/sclera: Conjunctivae normal.  Cardiovascular:     Rate and Rhythm: Normal rate and regular rhythm.  Pulmonary:     Effort: Pulmonary effort is normal. No respiratory distress.     Breath sounds: Normal breath sounds.  Abdominal:     General: There is no distension.     Palpations: Abdomen is soft.     Tenderness: There is generalized abdominal tenderness. There is no guarding or rebound.  Skin:    General: Skin is warm and dry.  Neurological:     General: No focal deficit present.     Mental Status: She is alert.    ED Results / Procedures / Treatments   Labs (all labs ordered are listed, but only abnormal results are displayed) Labs Reviewed  CBC WITH DIFFERENTIAL/PLATELET - Abnormal; Notable for the following components:  Result Value   Platelets 123 (*)    Lymphs Abs 0.3 (*)    All other components within normal limits  BASIC METABOLIC PANEL - Abnormal; Notable for the following components:   Sodium 134 (*)    CO2 19 (*)    Glucose, Bld 127 (*)    All other components within normal limits  URINALYSIS, ROUTINE W REFLEX MICROSCOPIC - Abnormal; Notable for the following components:   APPearance CLOUDY (*)    Bilirubin Urine SMALL (*)    Ketones, ur >=80 (*)    All other components within normal limits  RESP PANEL BY RT-PCR (FLU A&B, COVID) [id number]  PREGNANCY, URINE    EKG None  Radiology No results found.  Procedures Procedures    Medications Ordered in ED Medications  ondansetron (ZOFRAN)  injection 4 mg (4 mg Intravenous Patient Refused/Not Given 07/24/21 1947)  promethazine (PHENERGAN) 12.5 mg in sodium chloride 0.9 % 50 mL IVPB ( Intravenous Canceled Entry 07/24/21 1947)  sodium chloride 0.9 % bolus 1,000 mL (0 mLs Intravenous Stopped 07/24/21 1955)  ondansetron (ZOFRAN) injection 4 mg (4 mg Intravenous Given 07/24/21 1806)  promethazine (PHENERGAN) 25 MG/ML injection (12.5 mg  Given 07/24/21 1946)  ibuprofen (ADVIL) tablet 800 mg (800 mg Oral Given 07/24/21 1956)    ED Course/ Medical Decision Making/ A&P                           Medical Decision Making Patient is an otherwise healthy 41 year old female presents the emergency department for 2 days of vomiting and diarrhea.  She recently got back from a trip.  No recent antibiotic use.  On my exam she has generalized abdominal tenderness, no focal tenderness, guarding or rigidity. The differential diagnosis of diarrhea includes but is not limited to Viral- norovirus/rotavirus; Bacterial-Campylobacter,Shigella, Salmonella, Escherichia coli, E. coli 0157:H7, Yersinia enterocolitica, Vibrio cholerae, Clostridium difficile. Parasitic- Giardia lamblia, Cryptosporidium,Entamoeba histolytica,Cyclospora, Microsporidium. Toxin- Staphylococcus aureus, Bacillus cereus. Noninfectious causes include GI Bleed, Appendicitis, Mesenteric Ischemia, Diverticulitis, Adrenal Crisis, Thyroid Storm, Toxicologic exposures, Antibiotic or drug-associated, inflammatory bowel disease.  Problems Addressed: Viral gastroenteritis: acute illness or injury  Amount and/or Complexity of Data Reviewed Labs: ordered. Decision-making details documented in ED Course.    Details: Labs overall unremarkable. Electrolytes within normal limits. Urinalysis negative for hematuria or infection, urine pregnancy negative.   On reevaluation after IV fluids and antiemetics, patient states that her symptoms have somewhat improved.  She tolerated a p.o. challenge including fluid and  food.  Overall patient is clinically well-appearing, and hemodynamically stable.  I do not believe she is requiring admission or inpatient treatment for her symptoms.  Will discharge to home, and treat symptomatically for likely viral gastroenteritis.  Discussed reasons return the emergency department, patient agreeable to plan.  Final Clinical Impression(s) / ED Diagnoses Final diagnoses:  Viral gastroenteritis    Rx / DC Orders ED Discharge Orders     None      Portions of this report may have been transcribed using voice recognition software. Every effort was made to ensure accuracy; however, inadvertent computerized transcription errors may be present.    Estill Cotta 07/24/21 2122    Margette Fast, MD 07/30/21 1757

## 2021-07-24 NOTE — ED Triage Notes (Signed)
Pt c/o n/v/d, HA x 24 hours-NAD-steady gait

## 2021-08-11 IMAGING — RF UPPER GI SERIES W/HIGH DENSITY WITHOUT KUB
5 series · 14 of 24 positions shown · non-contrast
Comparison: 07/08/2018 CT abdomen/pelvis.

CLINICAL DATA: History of hiatal hernia and gastroesophageal reflux
disease. Status post esophageal dilation in December 2018. Persistent
epigastric abdominal pain, nausea and vomiting.

EXAM:
UPPER GI SERIES WITH KUB
TECHNIQUE: After obtaining a scout radiograph a routine upper GI series was
performed using thin and high density barium.
FLUOROSCOPY TIME:  Fluoroscopy Time:  2 minutes 0 seconds
Radiation Exposure Index (if provided by the fluoroscopic device):
One hundred eighty-one mGy
Number of Acquired Spot Images: 9

[Series 1: one shot · 0.14mm/px · 6 of 11 slices shown]
[im 1/11]
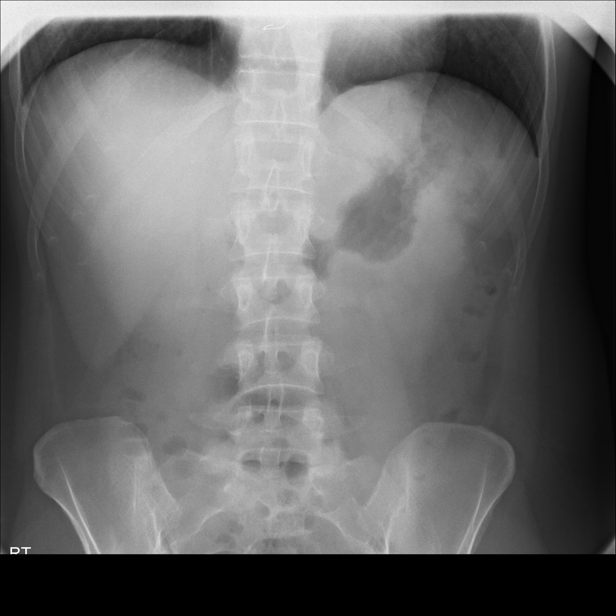
[im 3/11]
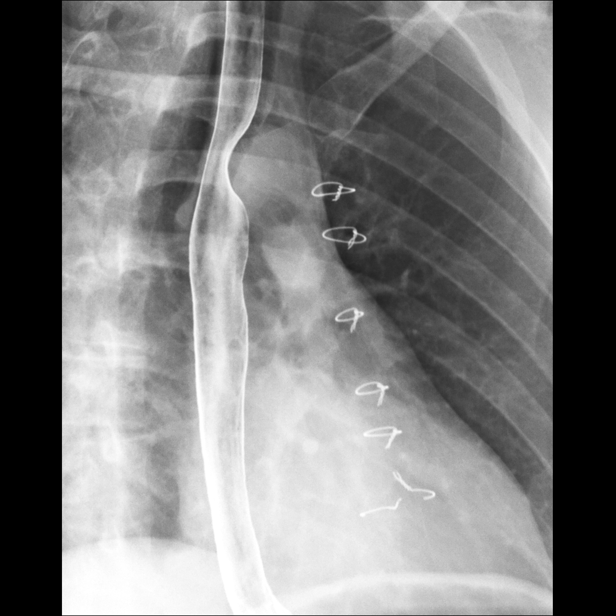
[im 6/11]
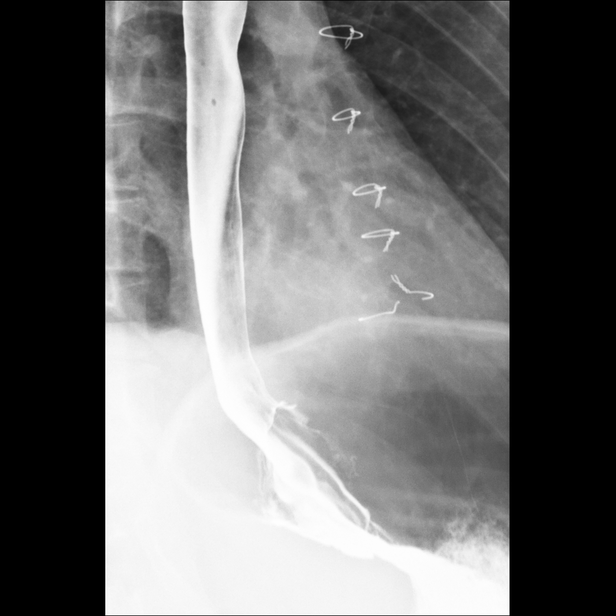
[im 8/11]
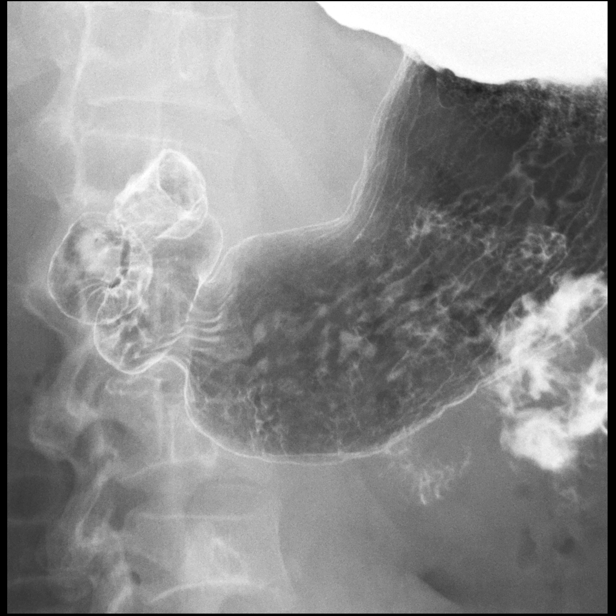
[im 9/11]
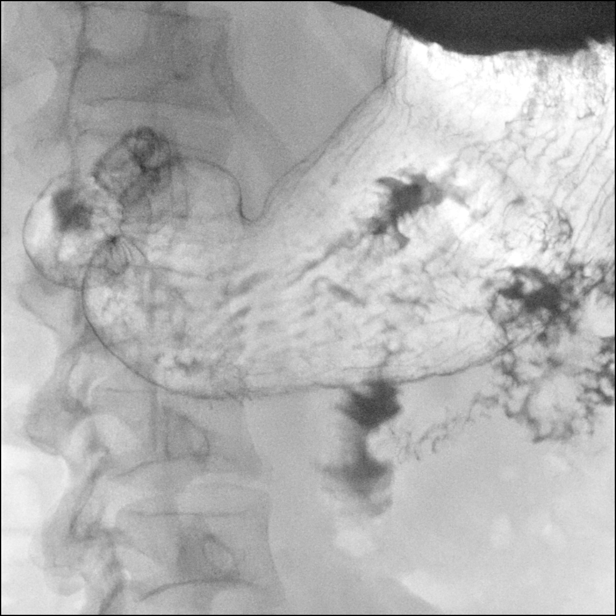
[im 11/11]
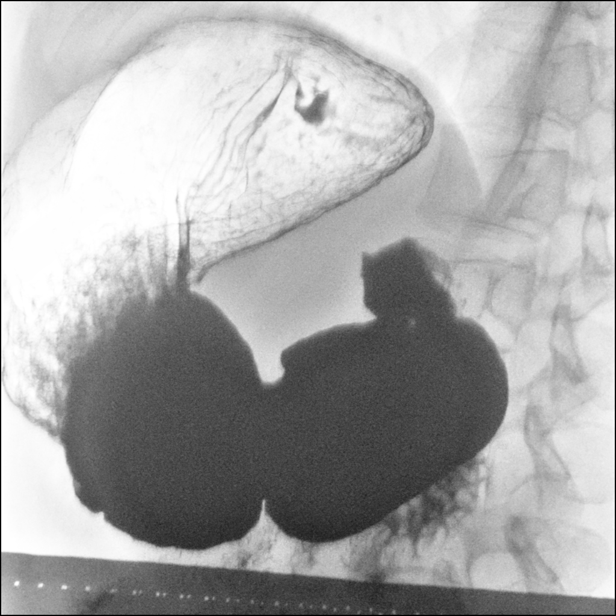

[Series 2: sequence · 2 of 29 frames shown (1 of 4)]
[frame 15/29]
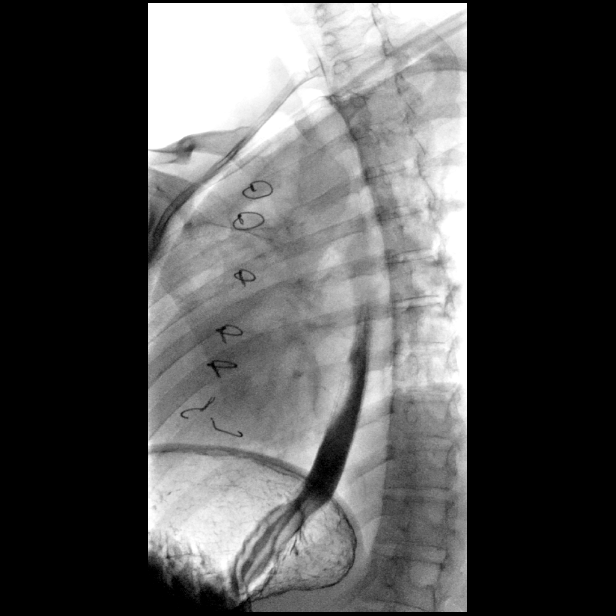
[frame 25/29]
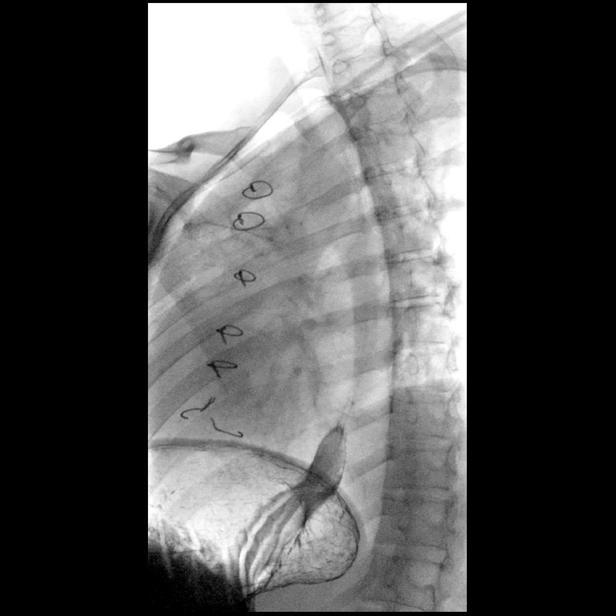

[Series 3: sequence · 2 of 93 frames shown (2 of 4)]
[frame 47/93]
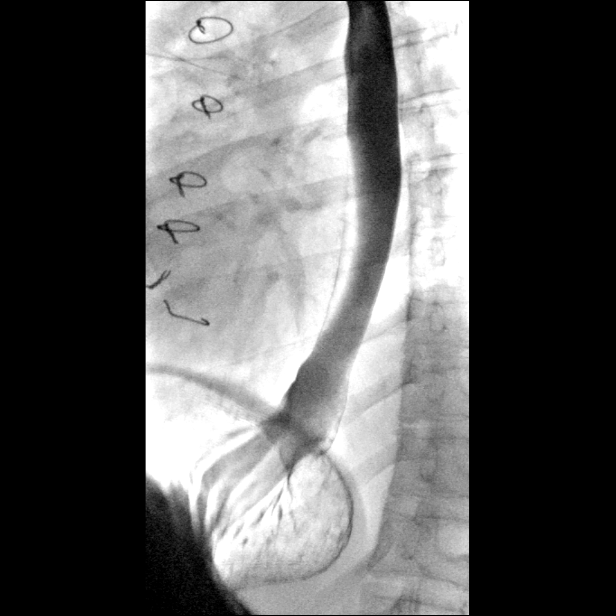
[frame 80/93]
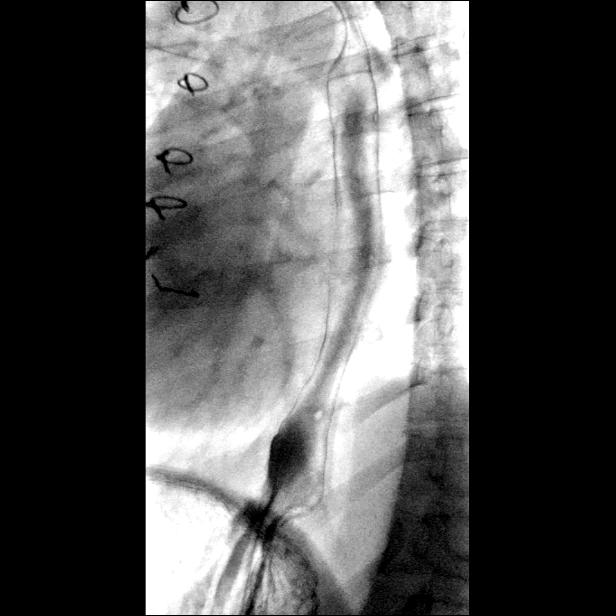

[Series 4: sequence · 2 of 4 frames shown (3 of 4)]
[frame 3/4]
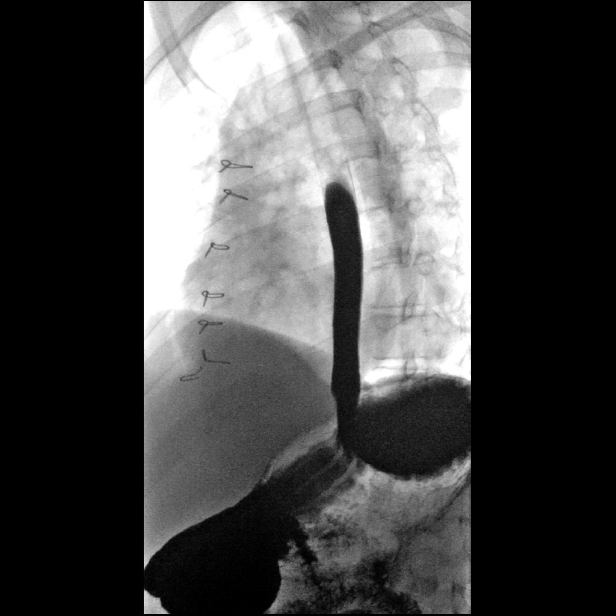
[frame 4/4]
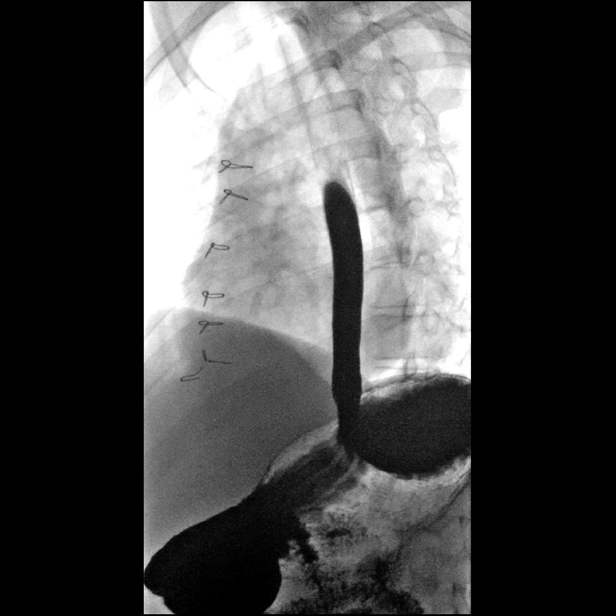

[Series 6: sequence · 2 of 39 frames shown (4 of 4)]
[frame 6/39]
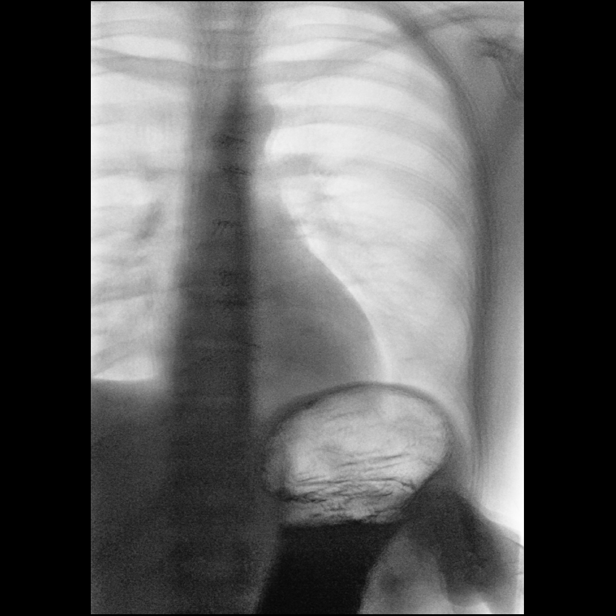
[frame 34/39]
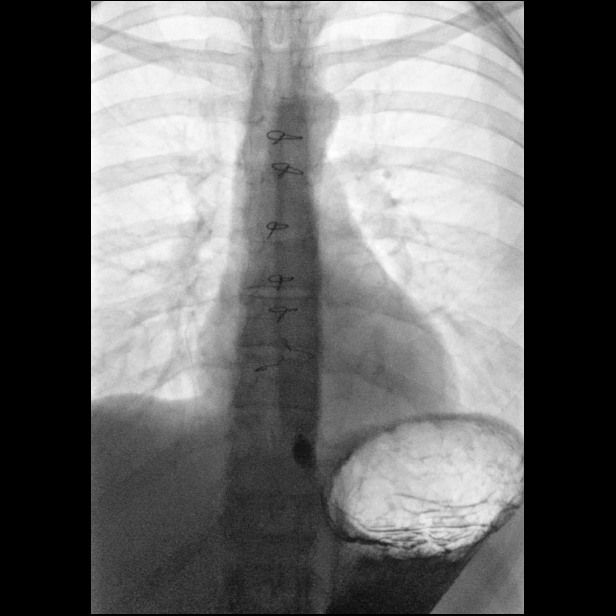

[14 of 24 positions shown; findings below may reference images not displayed]

FINDINGS: Scout radiograph demonstrates no dilated small bowel loops. Minimal
colonic stool. No evidence of pneumatosis or pneumoperitoneum. No
radiopaque nephrolithiasis. Clear lung bases.

Normal esophageal motility. Normal esophageal mucosa, with no
evidence of reflux esophagitis. Normal esophageal distensibility,
with no evidence of esophageal mass, stricture or ulcer. Barium
tablet traversed the esophagus into the stomach without delay. Small
sliding hiatal hernia. Moderate gastroesophageal reflux elicited to
the level of the upper thoracic esophagus with water siphon test.
Normal gastric emptying. No evidence of gastric fold thickening
filling defects, ulcers or significant erosions. Normal duodenal
bulb and C sweep, with no evidence of duodenal fold thickening,
filling defects, strictures or ulcers. Normal duodenal jejunal
junction to the left of the spine. Visualized proximal jejunal loops
are normal caliber and without fold thickening.
IMPRESSION: 1. Small sliding hiatal hernia. Moderate gastroesophageal reflux
elicited.
2. Normal esophageal motility.
3. Otherwise normal upper GI.
